# Patient Record
Sex: Male | Born: 1960 | Race: Black or African American | Hispanic: No | Marital: Married | Smoking: Never smoker
Health system: Southern US, Community
[De-identification: ages and names within clinical notes are randomized; demographics above are authoritative.]

## PROBLEM LIST (undated history)

## (undated) DIAGNOSIS — E559 Vitamin D deficiency, unspecified: Secondary | ICD-10-CM

## (undated) DIAGNOSIS — E119 Type 2 diabetes mellitus without complications: Secondary | ICD-10-CM

## (undated) DIAGNOSIS — E66812 Obesity, class 2: Secondary | ICD-10-CM

## (undated) DIAGNOSIS — E785 Hyperlipidemia, unspecified: Secondary | ICD-10-CM

## (undated) DIAGNOSIS — M1990 Unspecified osteoarthritis, unspecified site: Secondary | ICD-10-CM

## (undated) DIAGNOSIS — I10 Essential (primary) hypertension: Secondary | ICD-10-CM

## (undated) DIAGNOSIS — E669 Obesity, unspecified: Secondary | ICD-10-CM

## (undated) HISTORY — DX: Hyperlipidemia, unspecified: E78.5

## (undated) HISTORY — DX: Obesity, unspecified: E66.9

## (undated) HISTORY — DX: Obesity, class 2: E66.812

## (undated) HISTORY — DX: Vitamin D deficiency, unspecified: E55.9

## (undated) HISTORY — PX: CIRCUMCISION: SUR203

---

## 2016-08-04 ENCOUNTER — Emergency Department
Admission: EM | Admit: 2016-08-04 | Discharge: 2016-08-04 | Disposition: A | Discharge status: Home / Self Care | Payer: BLUE CROSS/BLUE SHIELD | Attending: Emergency Medicine | Admitting: Emergency Medicine

## 2016-08-04 ENCOUNTER — Emergency Department: Payer: BLUE CROSS/BLUE SHIELD

## 2016-08-04 ENCOUNTER — Encounter: Payer: Self-pay | Admitting: *Deleted

## 2016-08-04 DIAGNOSIS — I10 Essential (primary) hypertension: Secondary | ICD-10-CM | POA: Insufficient documentation

## 2016-08-04 DIAGNOSIS — S39012A Strain of muscle, fascia and tendon of lower back, initial encounter: Secondary | ICD-10-CM | POA: Insufficient documentation

## 2016-08-04 DIAGNOSIS — Y92410 Unspecified street and highway as the place of occurrence of the external cause: Secondary | ICD-10-CM | POA: Diagnosis not present

## 2016-08-04 DIAGNOSIS — Y9389 Activity, other specified: Secondary | ICD-10-CM | POA: Insufficient documentation

## 2016-08-04 DIAGNOSIS — S3992XA Unspecified injury of lower back, initial encounter: Secondary | ICD-10-CM | POA: Diagnosis present

## 2016-08-04 DIAGNOSIS — Y999 Unspecified external cause status: Secondary | ICD-10-CM | POA: Insufficient documentation

## 2016-08-04 DIAGNOSIS — R1310 Dysphagia, unspecified: Secondary | ICD-10-CM | POA: Diagnosis not present

## 2016-08-04 DIAGNOSIS — S8002XA Contusion of left knee, initial encounter: Secondary | ICD-10-CM | POA: Diagnosis not present

## 2016-08-04 DIAGNOSIS — E119 Type 2 diabetes mellitus without complications: Secondary | ICD-10-CM | POA: Diagnosis not present

## 2016-08-04 HISTORY — DX: Essential (primary) hypertension: I10

## 2016-08-04 HISTORY — DX: Type 2 diabetes mellitus without complications: E11.9

## 2016-08-04 MED ORDER — CYCLOBENZAPRINE HCL 5 MG PO TABS: 5.0000 mg | ORAL_TABLET | Freq: Three times a day (TID) | ORAL | 0 refills | Status: DC | PRN

## 2016-08-04 MED ORDER — NAPROXEN 500 MG PO TABS: 500.0000 mg | ORAL_TABLET | Freq: Two times a day (BID) | ORAL | 0 refills | Status: AC

## 2016-08-04 MED ORDER — NAPROXEN 500 MG PO TABS
500.0000 mg | ORAL_TABLET | Freq: Once | ORAL | Status: AC
  Administered 2016-08-04: 500 mg via ORAL
  Filled 2016-08-04: qty 1

## 2016-08-04 NOTE — ED Notes (Signed)
See provider assessment 
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
## 2017-05-17 ENCOUNTER — Telehealth: Payer: Self-pay

## 2017-05-17 ENCOUNTER — Other Ambulatory Visit: Payer: Self-pay

## 2017-05-17 DIAGNOSIS — Z1211 Encounter for screening for malignant neoplasm of colon: Secondary | ICD-10-CM

## 2017-05-17 NOTE — Telephone Encounter (Signed)
Gastroenterology Pre-Procedure Review  Request Date: 05/27/17 Requesting Physician: Dr. Allen Norris  PATIENT REVIEW QUESTIONS: The patient responded to the following health history questions as indicated:    1. Are you having any GI issues? no 2. Do you have a personal history of Polyps? no 3. Do you have a family history of Colon Cancer or Polyps? no 4. Diabetes Mellitus? yes (Oral meds type 2) 5. Joint replacements in the past 12 months?no 6. Major health problems in the past 3 months?no 7. Any artificial heart valves, MVP, or defibrillator?no    MEDICATIONS & ALLERGIES:    Patient reports the following regarding taking any anticoagulation/antiplatelet therapy:   Plavix, Coumadin, Eliquis, Xarelto, Lovenox, Pradaxa, Brilinta, or Effient? no Aspirin? yes (81 mg daily)  Patient confirms/reports the following medications:  Current Outpatient Medications  Medication Sig Dispense Refill  . cyclobenzaprine (FLEXERIL) 5 MG tablet Take 1 tablet (5 mg total) by mouth 3 (three) times daily as needed for muscle spasms. 20 tablet 0  . naproxen (NAPROSYN) 500 MG tablet Take 1 tablet (500 mg total) by mouth 2 (two) times daily with a meal. 30 tablet 0   No current facility-administered medications for this visit.     Patient confirms/reports the following allergies:  No Known Allergies  No orders of the defined types were placed in this encounter.   AUTHORIZATION INFORMATION Primary Insurance: 1D#: Group #:  Secondary Insurance: 1D#: Group #:  SCHEDULE INFORMATION: Date: 05/27/17 Time: Location:[facility]

## 2017-05-19 ENCOUNTER — Other Ambulatory Visit: Payer: Self-pay

## 2017-05-24 ENCOUNTER — Telehealth: Payer: Self-pay | Admitting: Gastroenterology

## 2017-05-24 MED ORDER — NA SULFATE-K SULFATE-MG SULF 17.5-3.13-1.6 GM/177ML PO SOLN: 1.0000 | Freq: Once | ORAL | 0 refills | Status: AC

## 2017-05-24 NOTE — Telephone Encounter (Signed)
Patient hasn't received his prepping package, please call patient.

## 2017-05-24 NOTE — Telephone Encounter (Signed)
Patient stated that he did not receive his colonoscopy instructions for 05/27/17.  I will email his instructions and electronically send his rx for Su-Prep to [facility].

## 2017-05-24 NOTE — Telephone Encounter (Signed)
Patient called back

## 2017-05-25 ENCOUNTER — Other Ambulatory Visit: Payer: Self-pay

## 2017-05-25 MED ORDER — PEG 3350-KCL-NABCB-NACL-NASULF 236 G PO SOLR: ORAL | 0 refills | Status: DC

## 2017-05-25 NOTE — Telephone Encounter (Signed)
Pt lm he wants to make sure we send rx to [facility] [address] for his colonoscopy  friday

## 2017-05-25 NOTE — Telephone Encounter (Signed)
Rx for Golytely has been sent to pt's pharmacy.

## 2017-05-26 ENCOUNTER — Encounter: Payer: Self-pay | Admitting: Nurse Practitioner

## 2017-05-27 ENCOUNTER — Ambulatory Visit: Payer: BLUE CROSS/BLUE SHIELD | Admitting: Anesthesiology

## 2017-05-27 ENCOUNTER — Ambulatory Visit
Admission: RE | Admit: 2017-05-27 | Discharge: 2017-05-27 | Disposition: A | Discharge status: Home / Self Care | Payer: BLUE CROSS/BLUE SHIELD | Source: Ambulatory Visit | Attending: Gastroenterology | Admitting: Gastroenterology

## 2017-05-27 ENCOUNTER — Encounter
Admission: RE | Disposition: A | Discharge status: Home / Self Care | Payer: Self-pay | Source: Ambulatory Visit | Attending: Gastroenterology

## 2017-05-27 DIAGNOSIS — E119 Type 2 diabetes mellitus without complications: Secondary | ICD-10-CM | POA: Insufficient documentation

## 2017-05-27 DIAGNOSIS — I10 Essential (primary) hypertension: Secondary | ICD-10-CM | POA: Insufficient documentation

## 2017-05-27 DIAGNOSIS — Z1211 Encounter for screening for malignant neoplasm of colon: Secondary | ICD-10-CM | POA: Diagnosis not present

## 2017-05-27 DIAGNOSIS — K640 First degree hemorrhoids: Secondary | ICD-10-CM | POA: Diagnosis not present

## 2017-05-27 DIAGNOSIS — Z79899 Other long term (current) drug therapy: Secondary | ICD-10-CM | POA: Diagnosis not present

## 2017-05-27 DIAGNOSIS — K635 Polyp of colon: Secondary | ICD-10-CM | POA: Diagnosis not present

## 2017-05-27 DIAGNOSIS — Z791 Long term (current) use of non-steroidal anti-inflammatories (NSAID): Secondary | ICD-10-CM | POA: Insufficient documentation

## 2017-05-27 DIAGNOSIS — D125 Benign neoplasm of sigmoid colon: Secondary | ICD-10-CM | POA: Diagnosis not present

## 2017-05-27 DIAGNOSIS — Z7984 Long term (current) use of oral hypoglycemic drugs: Secondary | ICD-10-CM | POA: Diagnosis not present

## 2017-05-27 DIAGNOSIS — D123 Benign neoplasm of transverse colon: Secondary | ICD-10-CM | POA: Diagnosis not present

## 2017-05-27 DIAGNOSIS — K5730 Diverticulosis of large intestine without perforation or abscess without bleeding: Secondary | ICD-10-CM | POA: Insufficient documentation

## 2017-05-27 DIAGNOSIS — Z7982 Long term (current) use of aspirin: Secondary | ICD-10-CM | POA: Diagnosis not present

## 2017-05-27 HISTORY — DX: Unspecified osteoarthritis, unspecified site: M19.90

## 2017-05-27 HISTORY — PX: POLYPECTOMY: SHX5525

## 2017-05-27 HISTORY — PX: COLONOSCOPY WITH PROPOFOL: SHX5780

## 2017-05-27 LAB — GLUCOSE, CAPILLARY
GLUCOSE-CAPILLARY: 77 mg/dL (ref 65–99)
Glucose-Capillary: 74 mg/dL (ref 65–99)

## 2017-05-27 SURGERY — COLONOSCOPY WITH PROPOFOL
Anesthesia: General | Site: Rectum | Wound class: Contaminated

## 2017-05-27 MED ORDER — PROPOFOL 10 MG/ML IV BOLUS
INTRAVENOUS | Status: DC | PRN
  Administered 2017-05-27: 60 mg via INTRAVENOUS
  Administered 2017-05-27 (×3): 20 mg via INTRAVENOUS
  Administered 2017-05-27: 60 mg via INTRAVENOUS
  Administered 2017-05-27 (×7): 20 mg via INTRAVENOUS
  Administered 2017-05-27: 60 mg via INTRAVENOUS
  Administered 2017-05-27 (×2): 20 mg via INTRAVENOUS

## 2017-05-27 MED ORDER — LACTATED RINGERS IV SOLN
INTRAVENOUS | Status: DC
  Administered 2017-05-27: 08:00:00 via INTRAVENOUS

## 2017-05-27 MED ORDER — FENTANYL CITRATE (PF) 100 MCG/2ML IJ SOLN: 25.0000 ug | INTRAMUSCULAR | Status: DC | PRN

## 2017-05-27 MED ORDER — LIDOCAINE HCL (CARDIAC) 20 MG/ML IV SOLN
INTRAVENOUS | Status: DC | PRN
  Administered 2017-05-27: 30 mg via INTRAVENOUS

## 2017-05-27 MED ORDER — SODIUM CHLORIDE 0.9 % IV SOLN: INTRAVENOUS | Status: DC

## 2017-05-27 MED ORDER — OXYCODONE HCL 5 MG/5ML PO SOLN: 5.0000 mg | Freq: Once | ORAL | Status: DC | PRN

## 2017-05-27 MED ORDER — MEPERIDINE HCL 25 MG/ML IJ SOLN: 6.2500 mg | INTRAMUSCULAR | Status: DC | PRN

## 2017-05-27 MED ORDER — OXYCODONE HCL 5 MG PO TABS: 5.0000 mg | ORAL_TABLET | Freq: Once | ORAL | Status: DC | PRN

## 2017-05-27 MED ORDER — PROMETHAZINE HCL 25 MG/ML IJ SOLN: 6.2500 mg | INTRAMUSCULAR | Status: DC | PRN

## 2017-05-27 MED ORDER — STERILE WATER FOR IRRIGATION IR SOLN
Status: DC | PRN
  Administered 2017-05-27: .5 mL

## 2017-05-27 SURGICAL SUPPLY — 8 items
CANISTER SUCT 1200ML W/VALVE (MISCELLANEOUS) ×4 IMPLANT
FORCEPS BIOP RAD 4 LRG CAP 4 (CUTTING FORCEPS) ×4 IMPLANT
GOWN CVR UNV OPN BCK APRN NK (MISCELLANEOUS) ×4 IMPLANT
GOWN ISOL THUMB LOOP REG UNIV (MISCELLANEOUS) ×8
KIT ENDO PROCEDURE OLY (KITS) ×4 IMPLANT
SNARE SHORT THROW 13M SML OVAL (MISCELLANEOUS) ×2 IMPLANT
TRAP ETRAP POLY (MISCELLANEOUS) ×2 IMPLANT
WATER STERILE IRR 250ML POUR (IV SOLUTION) ×4 IMPLANT

## 2017-05-27 NOTE — Anesthesia Preprocedure Evaluation (Signed)
Anesthesia Evaluation  Patient identified by MRN, date of birth, ID band Patient awake    Reviewed: Allergy & Precautions, H&P , NPO status , Patient's Chart, lab work & pertinent test results, reviewed documented beta blocker date and time   Airway Mallampati: II  TM Distance: >3 FB Neck ROM: full    Dental no notable dental hx.    Pulmonary neg pulmonary ROS,    Pulmonary exam normal breath sounds clear to auscultation       Cardiovascular Exercise Tolerance: Good hypertension,  Rhythm:regular Rate:Normal     Neuro/Psych negative neurological ROS  negative psych ROS   GI/Hepatic negative GI ROS, Neg liver ROS,   Endo/Other  diabetes  Renal/GU negative Renal ROS  negative genitourinary   Musculoskeletal   Abdominal   Peds  Hematology negative hematology ROS (+)   Anesthesia Other Findings   Reproductive/Obstetrics negative OB ROS                             Anesthesia Physical Anesthesia Plan  ASA: II  Anesthesia Plan: General   Post-op Pain Management:    Induction:   PONV Risk Score and Plan:   Airway Management Planned:   Additional Equipment:   Intra-op Plan:   Post-operative Plan:   Informed Consent: I have reviewed the patients History and Physical, chart, labs and discussed the procedure including the risks, benefits and alternatives for the proposed anesthesia with the patient or authorized representative who has indicated his/her understanding and acceptance.   Dental Advisory Given  Plan Discussed with: CRNA  Anesthesia Plan Comments:         Anesthesia Quick Evaluation

## 2017-05-27 NOTE — Anesthesia Procedure Notes (Signed)
Procedure Name: MAC Performed by: Evadna Donaghy, CRNA Pre-anesthesia Checklist: Patient identified, Emergency Drugs available, Suction available, Patient being monitored and Timeout performed Patient Re-evaluated:Patient Re-evaluated prior to induction Oxygen Delivery Method: Nasal cannula       

## 2017-05-27 NOTE — Op Note (Signed)
[facility] Gastroenterology Patient Name: Nathan Armstrong Procedure Date: 05/27/2017 7:35 AM MRN: [id number] Account #: [id number] Date of Birth: 09/26/1960 Admit Type: Outpatient Age: 57 Room: [facility] OR ROOM 01 Gender: Male Note Status: Finalized Procedure:            Colonoscopy Indications:          Screening for colorectal malignant neoplasm Providers:            [name] MD, MD Referring MD:         [place], MD (Referring MD) Medicines:            Propofol per Anesthesia Complications:        No immediate complications. Procedure:            Pre-Anesthesia Assessment:                       - Prior to the procedure, a History and Physical was                        performed, and patient medications and allergies were                        reviewed. The patient's tolerance of previous                        anesthesia was also reviewed. The risks and benefits of                        the procedure and the sedation options and risks were                        discussed with the patient. All questions were                        answered, and informed consent was obtained. Prior                        Anticoagulants: The patient has taken no previous                        anticoagulant or antiplatelet agents. ASA Grade                        Assessment: II - A patient with mild systemic disease.                        After reviewing the risks and benefits, the patient was                        deemed in satisfactory condition to undergo the                        procedure.                       After obtaining informed consent, the colonoscope was                        passed under direct vision. Throughout the procedure,  the patient's blood pressure, pulse, and oxygen                        saturations were monitored continuously. The Olympus                        Colonoscope [id number]) was  introduced through the                        anus and advanced to the the cecum, identified by                        appendiceal orifice and ileocecal valve. The                        colonoscopy was performed without difficulty. The                        patient tolerated the procedure well. The quality of                        the bowel preparation was excellent. Findings:      The perianal and digital rectal examinations were normal.      Four sessile polyps were found in the sigmoid colon. The polyps were 3       to 4 mm in size. These polyps were removed with a cold biopsy forceps.       Resection and retrieval were complete.      A 4 mm polyp was found in the transverse colon. The polyp was sessile.       The polyp was removed with a cold biopsy forceps. Resection and       retrieval were complete.      Non-bleeding internal hemorrhoids were found during retroflexion. The       hemorrhoids were Grade I (internal hemorrhoids that do not prolapse).      A few small-mouthed diverticula were found in the sigmoid colon. Impression:           - Four 3 to 4 mm polyps in the sigmoid colon, removed                        with a cold biopsy forceps. Resected and retrieved.                       - One 4 mm polyp in the transverse colon, removed with                        a cold biopsy forceps. Resected and retrieved.                       - Non-bleeding internal hemorrhoids.                       - Diverticulosis in the sigmoid colon. Recommendation:       - Discharge patient to home.                       - Resume previous diet.                       -  Continue present medications.                       - Await pathology results.                       - Repeat colonoscopy in 5 years if polyp adenoma and 10                        years if hyperplastic Procedure Code(s):    --- Professional ---                       [phone], Colonoscopy, flexible; with biopsy, single or                         multiple Diagnosis Code(s):    --- Professional ---                       Z12.11, Encounter for screening for malignant neoplasm                        of colon                       D12.5, Benign neoplasm of sigmoid colon                       D12.3, Benign neoplasm of transverse colon (hepatic                        flexure or splenic flexure) CPT copyright 2016 American Medical Association. All rights reserved. The codes documented in this report are preliminary and upon coder review may  be revised to meet current compliance requirements. [name] MD, MD 05/27/2017 8:26:06 AM This report has been signed electronically. Number of Addenda: 0 Note Initiated On: 05/27/2017 7:35 AM Scope Withdrawal Time: 0 hours 6 minutes 27 seconds  Total Procedure Duration: 0 hours 13 minutes 29 seconds       [facility]

## 2017-05-27 NOTE — Anesthesia Postprocedure Evaluation (Signed)
Anesthesia Post Note  Patient: Nathan Armstrong  Procedure(s) Performed: COLONOSCOPY WITH PROPOFOL (N/A Rectum) POLYPECTOMY (Rectum)  Patient location during evaluation: PACU Anesthesia Type: General Level of consciousness: awake and alert Pain management: pain level controlled Vital Signs Assessment: post-procedure vital signs reviewed and stable Respiratory status: spontaneous breathing, nonlabored ventilation, respiratory function stable and patient connected to nasal cannula oxygen Cardiovascular status: blood pressure returned to baseline and stable Postop Assessment: no apparent nausea or vomiting Anesthetic complications: no    SCOURAS, NICOLE ELAINE

## 2017-05-27 NOTE — H&P (Signed)
Nathan Lame, MD [facility] [address] [place], [place] [address] Phone: [phone] Fax : [phone]  Primary Care Physician:  Elisabeth Cara, NP Primary Gastroenterologist:  Dr. Allen Norris  Pre-Procedure History & Physical: HPI:  Nathan Armstrong is a 57 y.o. male is here for a screening colonoscopy.   Past Medical History:  Diagnosis Date  . Arthritis   . Diabetes mellitus without complication ([place])   . Hypertension     Past Surgical History:  Procedure Laterality Date  . CIRCUMCISION      Prior to Admission medications   Medication Sig Start Date End Date Taking? Authorizing Provider  aspirin 81 MG chewable tablet Chew by mouth daily.   Yes [provider]  carvedilol (COREG) 25 MG tablet Take 25 mg by mouth 2 (two) times daily with a meal.   Yes [provider]  glipiZIDE (GLUCOTROL) 10 MG tablet Take 10 mg by mouth daily before breakfast.   Yes [provider]  hydrochlorothiazide (HYDRODIURIL) 25 MG tablet Take 25 mg by mouth daily.   Yes [provider]  linagliptin (TRADJENTA) 5 MG TABS tablet Take 5 mg by mouth daily.   Yes [provider]  lisinopril (PRINIVIL,ZESTRIL) 40 MG tablet Take 40 mg by mouth daily.   Yes [provider]  metFORMIN (GLUCOPHAGE) 500 MG tablet Take 500 mg by mouth 2 (two) times daily with a meal.   Yes [provider]  Multiple Vitamin (MULTIVITAMIN) capsule Take 1 capsule by mouth daily.   Yes [provider]  naproxen (NAPROSYN) 500 MG tablet Take 1 tablet (500 mg total) by mouth 2 (two) times daily with a meal. 08/04/16 08/04/17 Yes Little, Traci M, PA-C  simvastatin (ZOCOR) 20 MG tablet Take 20 mg by mouth daily.   Yes [provider]  cyclobenzaprine (FLEXERIL) 5 MG tablet Take 1 tablet (5 mg total) by mouth 3 (three) times daily as needed for muscle spasms. Patient not taking: Reported on 05/27/2017 08/04/16   Little, Traci M, PA-C  polyethylene glycol  (GOLYTELY) 236 g solution Drink one 8 oz glass every 20 mins until stools are clear 05/25/17   Nathan Lame, MD    Allergies as of 05/17/2017  . (No Known Allergies)    History reviewed. No pertinent family history.  Social History   Socioeconomic History  . Marital status: Single    Spouse name: Not on file  . Number of children: Not on file  . Years of education: Not on file  . Highest education level: Not on file  Social Needs  . Financial resource strain: Not on file  . Food insecurity - worry: Not on file  . Food insecurity - inability: Not on file  . Transportation needs - medical: Not on file  . Transportation needs - non-medical: Not on file  Occupational History  . Not on file  Tobacco Use  . Smoking status: Never Smoker  . Smokeless tobacco: Never Used  Substance and Sexual Activity  . Alcohol use: Yes    Comment: occasional  . Drug use: No  . Sexual activity: Not on file  Other Topics Concern  . Not on file  Social History Narrative  . Not on file    Review of Systems: See HPI, otherwise negative ROS  Physical Exam: BP (!) 148/91   Pulse 72   Temp 97.7 F (36.5 C)   Ht 6\' 2"  (1.88 m)   Wt 297 lb (134.7 kg)   SpO2 98%  BMI 38.13 kg/m  General:   Alert,  pleasant and cooperative in NAD Head:  Normocephalic and atraumatic. Neck:  Supple; no masses or thyromegaly. Lungs:  Clear throughout to auscultation.    Heart:  Regular rate and rhythm. Abdomen:  Soft, nontender and nondistended. Normal bowel sounds, without guarding, and without rebound.   Neurologic:  Alert and  oriented x4;  grossly normal neurologically.  Impression/Plan: Nathan Armstrong is now here to undergo a screening colonoscopy.  Risks, benefits, and alternatives regarding colonoscopy have been reviewed with the patient.  Questions have been answered.  All parties agreeable.

## 2017-05-27 NOTE — Transfer of Care (Signed)
Immediate Anesthesia Transfer of Care Note  Patient: Nathan Armstrong  Procedure(s) Performed: COLONOSCOPY WITH PROPOFOL (N/A Rectum) POLYPECTOMY (Rectum)  Patient Location: PACU  Anesthesia Type: General  Level of Consciousness: awake, alert  and patient cooperative  Airway and Oxygen Therapy: Patient Spontanous Breathing and Patient connected to supplemental oxygen  Post-op Assessment: Post-op Vital signs reviewed, Patient's Cardiovascular Status Stable, Respiratory Function Stable, Patent Airway and No signs of Nausea or vomiting  Post-op Vital Signs: Reviewed and stable  Complications: No apparent anesthesia complications

## 2017-05-30 ENCOUNTER — Encounter: Payer: Self-pay | Admitting: Gastroenterology

## 2018-07-31 LAB — COMPREHENSIVE METABOLIC PANEL
Albumin: 4.7 (ref 3.5–5.0)
Calcium: 10.3 (ref 8.7–10.7)
GFR calc Af Amer: 48
GFR calc non Af Amer: 41
Globulin: 3

## 2018-07-31 LAB — LIPID PANEL
Cholesterol: 136 (ref 0–200)
HDL: 46 (ref 35–70)
LDL Cholesterol: 64
LDl/HDL Ratio: 1.4
Triglycerides: 129 (ref 40–160)

## 2018-07-31 LAB — BASIC METABOLIC PANEL
BUN: 29 — AB (ref 4–21)
CO2: 24 — AB (ref 13–22)
Chloride: 101 (ref 99–108)
Creatinine: 1.8 — AB (ref 0.6–1.3)
Glucose: 97
Potassium: 4.7 (ref 3.4–5.3)
Sodium: 137 (ref 137–147)

## 2018-07-31 LAB — HEPATIC FUNCTION PANEL
ALT: 15 (ref 10–40)
AST: 18 (ref 14–40)
Alkaline Phosphatase: 49 (ref 25–125)
Bilirubin, Total: 0.2

## 2018-07-31 LAB — HEMOGLOBIN A1C: Hemoglobin A1C: 9.5
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
## 2019-01-23 ENCOUNTER — Other Ambulatory Visit: Payer: Self-pay

## 2019-01-23 DIAGNOSIS — Z20822 Contact with and (suspected) exposure to covid-19: Secondary | ICD-10-CM

## 2019-01-24 LAB — NOVEL CORONAVIRUS, NAA: SARS-CoV-2, NAA: DETECTED — AB

## 2019-02-15 LAB — BASIC METABOLIC PANEL
BUN: 21 (ref 4–21)
CO2: 22 (ref 13–22)
Chloride: 101 (ref 99–108)
Creatinine: 1.4 — AB (ref <=1.3)
Glucose: 160
Potassium: 4.5 (ref 3.4–5.3)
Sodium: 139 (ref 137–147)

## 2019-02-15 LAB — HEPATIC FUNCTION PANEL
ALT: 13 (ref 10–40)
AST: 15 (ref 14–40)
Alkaline Phosphatase: 55 (ref 25–125)

## 2019-02-15 LAB — LIPID PANEL
Cholesterol: 143 (ref 0–200)
HDL: 43 (ref 35–70)
LDL Cholesterol: 57
LDl/HDL Ratio: 1.3
Triglycerides: 276 — AB (ref 40–160)

## 2019-02-15 LAB — COMPREHENSIVE METABOLIC PANEL
Albumin: 4 (ref 3.5–5.0)
Calcium: 9.4 (ref 8.7–10.7)
GFR calc Af Amer: 61
GFR calc non Af Amer: 53
Globulin: 3.7

## 2019-02-15 LAB — HEMOGLOBIN A1C: Hemoglobin A1C: 9.8

## 2019-05-23 LAB — CBC AND DIFFERENTIAL
HCT: 34 — AB (ref 41–53)
Hemoglobin: 11.4 — AB (ref 13.5–17.5)
Platelets: 181 (ref 150–399)
WBC: 6.2

## 2019-05-23 LAB — HEMOGLOBIN A1C: Hemoglobin A1C: 7.3

## 2019-05-23 LAB — BASIC METABOLIC PANEL
BUN: 28 — AB (ref 4–21)
CO2: 22 (ref 13–22)
Chloride: 104 (ref 99–108)
Creatinine: 1.6 — AB (ref 0.6–1.3)
Glucose: 80
Potassium: 4.6 (ref 3.4–5.3)
Sodium: 141 (ref 137–147)

## 2019-05-23 LAB — LIPID PANEL
Cholesterol: 110 (ref 0–200)
HDL: 42 (ref 35–70)
LDL Cholesterol: 51
Triglycerides: 83 (ref 40–160)

## 2019-05-23 LAB — COMPREHENSIVE METABOLIC PANEL
Albumin: 4 (ref 3.5–5.0)
Calcium: 9.4 (ref 8.7–10.7)
GFR calc Af Amer: 53
GFR calc non Af Amer: 45
Globulin: 3.3

## 2019-05-23 LAB — CBC: RBC: 3.87 (ref 3.87–5.11)

## 2019-05-24 LAB — LIPID PANEL
Cholesterol: 110 (ref 0–200)
HDL: 42 (ref 35–70)
LDL Cholesterol: 51

## 2019-05-24 LAB — BASIC METABOLIC PANEL: Creatinine: 1.6 — AB (ref 0.6–1.3)

## 2019-05-24 LAB — HEMOGLOBIN A1C: Hemoglobin A1C: 7.3

## 2019-05-24 LAB — TSH: TSH: 1.48 (ref 0.41–5.90)

## 2019-05-24 LAB — MICROALBUMIN, URINE: Microalb, Ur: 30

## 2019-06-12 ENCOUNTER — Ambulatory Visit: Payer: BC Managed Care – PPO | Attending: Neurology

## 2019-06-12 DIAGNOSIS — G4733 Obstructive sleep apnea (adult) (pediatric): Secondary | ICD-10-CM | POA: Diagnosis not present

## 2019-06-13 ENCOUNTER — Other Ambulatory Visit: Payer: Self-pay

## 2019-07-23 ENCOUNTER — Other Ambulatory Visit
Admission: RE | Admit: 2019-07-23 | Discharge: 2019-07-23 | Disposition: A | Discharge status: Home / Self Care | Payer: BC Managed Care – PPO | Source: Ambulatory Visit | Attending: Family Medicine | Admitting: Family Medicine

## 2019-07-23 ENCOUNTER — Other Ambulatory Visit: Payer: Self-pay

## 2019-07-23 DIAGNOSIS — Z20822 Contact with and (suspected) exposure to covid-19: Secondary | ICD-10-CM | POA: Diagnosis not present

## 2019-07-23 DIAGNOSIS — Z01812 Encounter for preprocedural laboratory examination: Secondary | ICD-10-CM | POA: Insufficient documentation

## 2019-07-24 LAB — SARS CORONAVIRUS 2 (TAT 6-24 HRS): SARS Coronavirus 2: NEGATIVE

## 2019-07-25 ENCOUNTER — Ambulatory Visit: Payer: BC Managed Care – PPO | Attending: Nurse Practitioner

## 2019-07-25 DIAGNOSIS — G4733 Obstructive sleep apnea (adult) (pediatric): Secondary | ICD-10-CM | POA: Diagnosis present

## 2019-07-26 ENCOUNTER — Other Ambulatory Visit: Payer: Self-pay

## 2019-09-08 LAB — CBC AND DIFFERENTIAL
HCT: 32 — AB (ref 41–53)
Hemoglobin: 10.4 — AB (ref 13.5–17.5)
Neutrophils Absolute: 4
Platelets: 198 (ref 150–399)
WBC: 7.3

## 2019-09-08 LAB — BASIC METABOLIC PANEL
BUN: 27 — AB (ref 4–21)
CO2: 22 (ref 13–22)
Chloride: 105 (ref 99–108)
Creatinine: 1.4 — AB (ref 0.6–1.3)
Glucose: 90
Potassium: 4.7 (ref 3.4–5.3)
Sodium: 139 (ref 137–147)

## 2019-09-08 LAB — HEPATIC FUNCTION PANEL
ALT: 9 — AB (ref 10–40)
AST: 15 (ref 14–40)
Alkaline Phosphatase: 47 (ref 25–125)

## 2019-09-08 LAB — HEMOGLOBIN A1C: Hemoglobin A1C: 6.2

## 2019-09-08 LAB — LIPID PANEL
Cholesterol: 132 (ref 0–200)
HDL: 47 (ref 35–70)
LDL Cholesterol: 64
Triglycerides: 120 (ref 40–160)

## 2019-09-08 LAB — COMPREHENSIVE METABOLIC PANEL
Albumin: 4.2 (ref 3.5–5.0)
Calcium: 9.6 (ref 8.7–10.7)
GFR calc Af Amer: 64
GFR calc non Af Amer: 55
Globulin: 3.2

## 2019-09-08 LAB — TSH: TSH: 1 (ref 0.41–5.90)

## 2019-09-08 LAB — CBC: RBC: 3.51 — AB (ref 3.87–5.11)

## 2020-07-01 ENCOUNTER — Other Ambulatory Visit: Payer: Self-pay

## 2020-07-01 ENCOUNTER — Encounter: Payer: Self-pay | Admitting: Family Medicine

## 2020-07-01 ENCOUNTER — Ambulatory Visit (INDEPENDENT_AMBULATORY_CARE_PROVIDER_SITE_OTHER): Payer: No Typology Code available for payment source | Admitting: Family Medicine

## 2020-07-01 VITALS — BP 128/70 | HR 63 | Temp 97.9°F | Resp 18 | Ht 74.0 in | Wt 259.0 lb

## 2020-07-01 DIAGNOSIS — E785 Hyperlipidemia, unspecified: Secondary | ICD-10-CM | POA: Diagnosis not present

## 2020-07-01 DIAGNOSIS — N400 Enlarged prostate without lower urinary tract symptoms: Secondary | ICD-10-CM

## 2020-07-01 DIAGNOSIS — E1169 Type 2 diabetes mellitus with other specified complication: Secondary | ICD-10-CM

## 2020-07-01 NOTE — Progress Notes (Signed)
Provider:  Alain Honey, MD  Careteam: Patient Care Team: Wardell Honour, MD as PCP - General (Family Medicine)  PLACE OF SERVICE:  [place] Directive information Does Patient Have a Medical Advance Directive?: No, Would patient like information on creating a medical advance directive?: No - Patient declined  No Known Allergies  Chief Complaint  Patient presents with  . Establish Care    Establish Care     HPI: Patient is a 60 y.o. male .  He is new here.  He had previously seen a physician at [facility] in [place] and even though he lives in [place] he formerly lived in [place] and prefers to come to [place] for medical care.  He has a history of diabetes, hypertension, and hyperlipidemia. His job requires lots of walking and he occasionally has some swelling in his left knee.  This has been going on for the past 1 to 1-1/2 months. He recalls last A1c being in the low sevens.  Lipids have been managed with simvastatin and there are no side effects with the statin. He has no prostate issues, being African-American male.  There is no family history of prostate cancer.  Review of Systems:  Review of Systems  Constitutional: Negative.   HENT: Negative.   Eyes: Negative.   Respiratory: Negative.   Cardiovascular: Negative.   Gastrointestinal: Negative.   Genitourinary: Negative.   Musculoskeletal: Positive for joint pain.       L knee with swelling but no crepitance  Skin: Negative.   Neurological: Negative.   Psychiatric/Behavioral: Negative.   All other systems reviewed and are negative.   Past Medical History:  Diagnosis Date  . Arthritis   . Diabetes mellitus without complication ([place])   . Hypertension    Past Surgical History:  Procedure Laterality Date  . CIRCUMCISION    . COLONOSCOPY WITH PROPOFOL N/A 05/27/2017   Procedure: COLONOSCOPY WITH PROPOFOL;  Surgeon: [name], MD;  Location: [facility];  Service:  Endoscopy;  Laterality: N/A;  Diabetic  . POLYPECTOMY  05/27/2017   Procedure: POLYPECTOMY;  Surgeon: [name], MD;  Location: [place];  Service: Endoscopy;;   Social History:   reports that he has never smoked. He has never used smokeless tobacco. He reports current alcohol use. He reports that he does not use drugs.  History reviewed. No pertinent family history.  Medications: Patient's Medications  New Prescriptions   No medications on file  Previous Medications   ASPIRIN 81 MG CHEWABLE TABLET    Chew by mouth daily.   CARVEDILOL (COREG) 25 MG TABLET    Take 25 mg by mouth 2 (two) times daily with a meal.   CETIRIZINE (ZYRTEC) 10 MG TABLET    Take 10 mg by mouth daily.   GLIPIZIDE (GLUCOTROL) 10 MG TABLET    Take 10 mg by mouth 2 (two) times daily before a meal.   HYDROCHLOROTHIAZIDE (HYDRODIURIL) 25 MG TABLET    Take 25 mg by mouth daily.   LINAGLIPTIN (TRADJENTA) 5 MG TABS TABLET    Take 5 mg by mouth daily.   LISINOPRIL (PRINIVIL,ZESTRIL) 40 MG TABLET    Take 40 mg by mouth daily.   METFORMIN (GLUCOPHAGE) 500 MG TABLET    Take 500 mg by mouth 2 (two) times daily with a meal.   MULTIPLE VITAMIN (MULTIVITAMIN) CAPSULE    Take 1 capsule by mouth daily.   NAPROXEN SODIUM (ALEVE) 220 MG TABLET    Take 220  mg by mouth as needed.   SIMVASTATIN (ZOCOR) 20 MG TABLET    Take 20 mg by mouth daily.   TIZANIDINE (ZANAFLEX) 4 MG TABLET    Take 4 mg by mouth 3 (three) times daily. For back pain  Modified Medications   No medications on file  Discontinued Medications   CYCLOBENZAPRINE (FLEXERIL) 5 MG TABLET    Take 1 tablet (5 mg total) by mouth 3 (three) times daily as needed for muscle spasms.   POLYETHYLENE GLYCOL (GOLYTELY) 236 G SOLUTION    Drink one 8 oz glass every 20 mins until stools are clear    Physical Exam:  Vitals:   07/01/20 1308  BP: 128/70  Pulse: 63  Resp: 18  Temp: 97.9 F (36.6 C)  TempSrc: Temporal  SpO2: 98%  Weight: 259 lb (117.5 kg)  Height: 6'  2" (1.88 m)   Body mass index is 33.25 kg/m. Wt Readings from Last 3 Encounters:  07/01/20 259 lb (117.5 kg)  05/27/17 297 lb (134.7 kg)  08/04/16 280 lb (127 kg)    Physical Exam Vitals and nursing note reviewed.  Constitutional:      Appearance: Normal appearance. He is obese.  HENT:     Head: Normocephalic.     Right Ear: Tympanic membrane normal.     Left Ear: Tympanic membrane normal.     Nose: Nose normal.     Mouth/Throat:     Mouth: Mucous membranes are moist.  Eyes:     Extraocular Movements: Extraocular movements intact.     Pupils: Pupils are equal, round, and reactive to light.  Cardiovascular:     Pulses: Normal pulses.     Heart sounds: Normal heart sounds.  Pulmonary:     Effort: Pulmonary effort is normal.     Breath sounds: Normal breath sounds.  Abdominal:     Palpations: Abdomen is soft.     Tenderness: There is abdominal tenderness. There is no guarding.  Genitourinary:    Penis: Normal.      Prostate: Normal.     Rectum: Normal.  Musculoskeletal:     Comments: There is some effusion of the left knee.  No crepitance felt.  Stability tests are normal.  Skin:    General: Skin is warm and dry.  Neurological:     General: No focal deficit present.     Mental Status: He is alert and oriented to person, place, and time.  Psychiatric:        Mood and Affect: Mood normal.        Behavior: Behavior normal.        Thought Content: Thought content normal.     Labs reviewed: Basic Metabolic Panel: No results for input(s): NA, K, CL, CO2, GLUCOSE, BUN, CREATININE, CALCIUM, MG, PHOS, TSH in the last 8760 hours. Liver Function Tests: No results for input(s): AST, ALT, ALKPHOS, BILITOT, PROT, ALBUMIN in the last 8760 hours. No results for input(s): LIPASE, AMYLASE in the last 8760 hours. No results for input(s): AMMONIA in the last 8760 hours. CBC: No results for input(s): WBC, NEUTROABS, HGB, HCT, MCV, PLT in the last 8760 hours. Lipid Panel: No  results for input(s): CHOL, HDL, LDLCALC, TRIG, CHOLHDL, LDLDIRECT in the last 8760 hours. TSH: No results for input(s): TSH in the last 8760 hours. A1C: No results found for: HGBA1C   Assessment/Plan  1. Type 2 diabetes mellitus with other specified complication, without long-term current use of insulin [facility]) Patient doing well with the combination  sulfonylurea Metformin Tradjenta and other unknown injectable weekly medicine.  Need to get baseline A1c today - Hemoglobin A1c - CMP  2. Hyperlipidemia associated with type 2 diabetes mellitus ([place]) Lipids have been controlled historically.  I do not have access to any previous labs so baseline today - Lipid Panel - CMP  3. Benign prostatic hyperplasia without lower urinary tract symptoms Prostate exam is unremarkable except some hypertrophy no nodules appreciated - PSA   Alain Honey, MD [facility] [phone]

## 2020-07-02 LAB — COMPREHENSIVE METABOLIC PANEL
AG Ratio: 1.2 (calc) (ref 1.0–2.5)
ALT: 12 U/L (ref 9–46)
AST: 16 U/L (ref 10–35)
Albumin: 4.3 g/dL (ref 3.6–5.1)
Alkaline phosphatase (APISO): 54 U/L (ref 35–144)
BUN/Creatinine Ratio: 19 (calc) (ref 6–22)
BUN: 26 mg/dL — ABNORMAL HIGH (ref 7–25)
CO2: 27 mmol/L (ref 20–32)
Calcium: 9.9 mg/dL (ref 8.6–10.3)
Chloride: 105 mmol/L (ref 98–110)
Creat: 1.34 mg/dL — ABNORMAL HIGH (ref 0.70–1.33)
Globulin: 3.6 g/dL (calc) (ref 1.9–3.7)
Glucose, Bld: 56 mg/dL — ABNORMAL LOW (ref 65–99)
Potassium: 4.1 mmol/L (ref 3.5–5.3)
Sodium: 138 mmol/L (ref 135–146)
Total Bilirubin: 0.3 mg/dL (ref 0.2–1.2)
Total Protein: 7.9 g/dL (ref 6.1–8.1)

## 2020-07-02 LAB — HEMOGLOBIN A1C
Hgb A1c MFr Bld: 6 % of total Hgb — ABNORMAL HIGH (ref <5.7)
Mean Plasma Glucose: 126 mg/dL
eAG (mmol/L): 7 mmol/L

## 2020-07-02 LAB — LIPID PANEL
Cholesterol: 128 mg/dL (ref <200)
HDL: 52 mg/dL (ref >=40)
LDL Cholesterol (Calc): 63 mg/dL (calc)
Non-HDL Cholesterol (Calc): 76 mg/dL (calc) (ref <130)
Total CHOL/HDL Ratio: 2.5 (calc) (ref <5.0)
Triglycerides: 52 mg/dL (ref <150)

## 2020-07-02 LAB — PSA: PSA: 0.47 ng/mL (ref <=4.0)

## 2020-08-15 ENCOUNTER — Ambulatory Visit: Payer: No Typology Code available for payment source | Admitting: Orthopedic Surgery

## 2020-08-15 ENCOUNTER — Other Ambulatory Visit: Payer: Self-pay

## 2020-08-15 ENCOUNTER — Encounter: Payer: Self-pay | Admitting: Orthopedic Surgery

## 2020-08-15 VITALS — BP 142/78 | HR 63 | Temp 97.5°F | Resp 18 | Ht 74.0 in | Wt 261.6 lb

## 2020-08-15 DIAGNOSIS — G8929 Other chronic pain: Secondary | ICD-10-CM | POA: Diagnosis not present

## 2020-08-15 DIAGNOSIS — M545 Low back pain, unspecified: Secondary | ICD-10-CM | POA: Diagnosis not present

## 2020-08-15 MED ORDER — GABAPENTIN 100 MG PO CAPS: 100.0000 mg | ORAL_CAPSULE | Freq: Every day | ORAL | 3 refills | Status: DC

## 2020-08-15 NOTE — Progress Notes (Signed)
Careteam: Patient Care Team: Wardell Honour, MD as PCP - General (Family Medicine)  Seen by: Windell Moulding, AGNP-C  PLACE OF SERVICE:  [place] Directive information Does Patient Have a Medical Advance Directive?: No, Would patient like information on creating a medical advance directive?: No - Patient declined  No Known Allergies  Chief Complaint  Patient presents with  . Acute Visit    Patient complains of back pain for 1 week. Patient states he has been taking tylenol 2 325 tabs and Tizanidine 4mg  as needed with little relief.     HPI: Patient is a 60 y.o. male seen today for acute visit for back pain.   Reports back pain for past week. Initial onset when he woke up in the morning. Unsure if he slept in a awkward position. Initially his right foot and knee hurt. Pain in foot and knee resolved and back pain began. He reports history of intermittent back pain the last 20 years. Denies recent heavy lifting or strenous activity. Back pain rated 7/10. Described as sharp. He has been taking tizanidine 2-3 times a day. He has also been ambulating about 4000 steps at his job daily. Muscle relaxer and walking have helped reduce pain.   He had a surgical procedure in the past requiring spinal anesthesia. Reports having intermittent back pain after procedure. He can feel a knot where the spinal was done.   Review of Systems:  Review of Systems  Constitutional: Negative for chills, fever, malaise/fatigue and weight loss.  Respiratory: Negative for cough, shortness of breath and wheezing.   Cardiovascular: Negative for chest pain and palpitations.  Musculoskeletal: Positive for back pain. Negative for falls.  Neurological: Negative for tingling, tremors and weakness.  Psychiatric/Behavioral: Negative for depression. The patient is not nervous/anxious.     Past Medical History:  Diagnosis Date  . Arthritis   . Class 2 obesity   . Diabetes mellitus without complication  ([place])   . HLD (hyperlipidemia)   . Hypertension   . Vitamin D deficiency    Past Surgical History:  Procedure Laterality Date  . CIRCUMCISION    . COLONOSCOPY WITH PROPOFOL N/A 05/27/2017   Procedure: COLONOSCOPY WITH PROPOFOL;  Surgeon: [name], MD;  Location: [facility];  Service: Endoscopy;  Laterality: N/A;  Diabetic  . POLYPECTOMY  05/27/2017   Procedure: POLYPECTOMY;  Surgeon: [name], MD;  Location: [facility];  Service: Endoscopy;;   Social History:   reports that he has never smoked. He has never used smokeless tobacco. He reports current alcohol use. He reports that he does not use drugs.  History reviewed. No pertinent family history.  Medications: Patient's Medications  New Prescriptions   No medications on file  Previous Medications   ASPIRIN 81 MG CHEWABLE TABLET    Chew by mouth daily.   CARVEDILOL (COREG) 25 MG TABLET    Take 25 mg by mouth 2 (two) times daily with a meal.   CETIRIZINE (ZYRTEC) 10 MG TABLET    Take 10 mg by mouth daily.   GLIPIZIDE (GLUCOTROL) 10 MG TABLET    Take 10 mg by mouth 2 (two) times daily before a meal.   HYDROCHLOROTHIAZIDE (HYDRODIURIL) 25 MG TABLET    Take 25 mg by mouth daily.   LINAGLIPTIN (TRADJENTA) 5 MG TABS TABLET    Take 5 mg by mouth daily.   LISINOPRIL (PRINIVIL,ZESTRIL) 40 MG TABLET    Take 40 mg by mouth daily.   METFORMIN (  GLUCOPHAGE-XR) 500 MG 24 HR TABLET    Take 1,000 mg by mouth 2 (two) times daily.   MULTIPLE VITAMIN (MULTIVITAMIN) CAPSULE    Take 1 capsule by mouth daily.   SIMVASTATIN (ZOCOR) 20 MG TABLET    Take 20 mg by mouth daily.   TIZANIDINE (ZANAFLEX) 4 MG TABLET    Take 4 mg by mouth 3 (three) times daily. For back pain   TRULICITY 0.17 PZ/0.2HE SOPN    Inject 2 mLs into the skin once a week.  Modified Medications   No medications on file  Discontinued Medications   METFORMIN (GLUCOPHAGE) 500 MG TABLET    Take 500 mg by mouth 2 (two) times daily with a meal.    Physical  Exam:  Vitals:   08/15/20 1016  BP: (!) 142/78  Pulse: 63  Resp: 18  Temp: (!) 97.5 F (36.4 C)  SpO2: 99%  Weight: 261 lb 9.6 oz (118.7 kg)  Height: 6\' 2"  (1.88 m)   Body mass index is 33.59 kg/m. Wt Readings from Last 3 Encounters:  08/15/20 261 lb 9.6 oz (118.7 kg)  07/01/20 259 lb (117.5 kg)  05/27/17 297 lb (134.7 kg)    Physical Exam Vitals reviewed.  Constitutional:      General: He is not in acute distress. HENT:     Head: Normocephalic.  Cardiovascular:     Rate and Rhythm: Normal rate and regular rhythm.     Pulses: Normal pulses.     Heart sounds: Normal heart sounds.  Pulmonary:     Effort: Pulmonary effort is normal. No respiratory distress.     Breath sounds: Normal breath sounds. No wheezing.  Musculoskeletal:     Cervical back: Normal.     Thoracic back: Normal.     Lumbar back: No swelling, deformity or tenderness. Normal range of motion.     Right lower leg: No edema.     Left lower leg: No edema.     Comments: Small dime sized area palpated on lower back above coccyx. Non-tender.   Skin:    General: Skin is warm and dry.     Capillary Refill: Capillary refill takes less than 2 seconds.  Neurological:     General: No focal deficit present.     Mental Status: He is alert and oriented to person, place, and time.  Psychiatric:        Mood and Affect: Mood normal.        Behavior: Behavior normal.    Labs reviewed: Basic Metabolic Panel: Recent Labs    09/08/19 0000 07/01/20 1408  NA 139 138  K 4.7 4.1  CL 105 105  CO2 22 27  GLUCOSE  --  56*  BUN 27* 26*  CREATININE 1.4* 1.34*  CALCIUM 9.6 9.9  TSH 1.00  --    Liver Function Tests: Recent Labs    09/08/19 0000 07/01/20 1408  AST 15 16  ALT 9* 12  ALKPHOS 47  --   BILITOT  --  0.3  PROT  --  7.9  ALBUMIN 4.2  --    No results for input(s): LIPASE, AMYLASE in the last 8760 hours. No results for input(s): AMMONIA in the last 8760 hours. CBC: Recent Labs    09/08/19 0000   WBC 7.3  NEUTROABS 4.00  HGB 10.4*  HCT 32*  PLT 198   Lipid Panel: Recent Labs    09/08/19 0000 07/01/20 1408  CHOL 132 128  HDL 47 52  LDLCALC  64 63  TRIG 120 52  CHOLHDL  --  2.5   TSH: Recent Labs    09/08/19 0000  TSH 1.00   A1C: Lab Results  Component Value Date   HGBA1C 6.0 (H) 07/01/2020     Assessment/Plan 1. Chronic midline low back pain without sciatica - suspect he slept in awkward position, denies strenuous activity - small lump noted above coccyx, nontender - cont tizanidine - recommend tylenol 1000 mg po tid x 7-14 days - cont light walking - avoid heavy lifting, bending and pulling mechanics - gabapentin (NEURONTIN) 100 MG capsule; Take 1 capsule (100 mg total) by mouth at bedtime.  Dispense: 90 capsule; Refill: 3 - may consider xray and PT in future if pain does not resolve  Total time: 21 minutes. Greater than 50% of total time spent doing patient education on symptom management.   Next appt: Visit date not found [place] [phone]

## 2020-08-15 NOTE — Patient Instructions (Signed)
May take muscle relaxer every morning, afternoon and night- please take tylenol 1000 mg every morning and evening (you may given extra dose in afternoon)  Gabapentin ordered for nerve/back pain- take one capsule at night (if after one week no effect you may double dose)   Chronic Back Pain When back pain lasts longer than 3 months, it is called chronic back pain. The cause of your back pain may not be known. Some common causes include:  Wear and tear (degenerative disease) of the bones, ligaments, or disks in your back.  Inflammation and stiffness in your back (arthritis). People who have chronic back pain often go through certain periods in which the pain is more intense (flare-ups). Many people can learn to manage the pain with home care. Follow these instructions at home: Pay attention to any changes in your symptoms. Take these actions to help with your pain: Managing pain and stiffness  If directed, apply ice to the painful area. Your health care provider may recommend applying ice during the first 24-48 hours after a flare-up begins. To do this: ? Put ice in a plastic bag. ? Place a towel between your skin and the bag. ? Leave the ice on for 20 minutes, 2-3 times per day.  If directed, apply heat to the affected area as often as told by your health care provider. Use the heat source that your health care provider recommends, such as a moist heat pack or a heating pad. ? Place a towel between your skin and the heat source. ? Leave the heat on for 20-30 minutes. ? Remove the heat if your skin turns bright red. This is especially important if you are unable to feel pain, heat, or cold. You may have a greater risk of getting burned.  Try soaking in a warm tub.      Activity  Avoid bending and other activities that make the problem worse.  Maintain a proper position when standing or sitting: ? When standing, keep your upper back and neck straight, with your shoulders pulled back.  Avoid slouching. ? When sitting, keep your back straight and relax your shoulders. Do not round your shoulders or pull them backward.  Do not sit or stand in one place for long periods of time.  Take brief periods of rest throughout the day. This will reduce your pain. Resting in a lying or standing position is usually better than sitting to rest.  When you are resting for longer periods, mix in some mild activity or stretching between periods of rest. This will help to prevent stiffness and pain.  Get regular exercise. Ask your health care provider what activities are safe for you.  Do not lift anything that is heavier than 10 lb (4.5 kg), or the limit that you are told, until your health care provider says that it is safe. Always use proper lifting technique, which includes: ? Bending your knees. ? Keeping the load close to your body. ? Avoiding twisting.  Sleep on a firm mattress in a comfortable position. Try lying on your side with your knees slightly bent. If you lie on your back, put a pillow under your knees.   Medicines  Treatment may include medicines for pain and inflammation taken by mouth or applied to the skin, prescription pain medicine, or muscle relaxants. Take over-the-counter and prescription medicines only as told by your health care provider.  Ask your health care provider if the medicine prescribed to you: ? Requires you to avoid  driving or using machinery. ? Can cause constipation. You may need to take these actions to prevent or treat constipation:  Drink enough fluid to keep your urine pale yellow.  Take over-the-counter or prescription medicines.  Eat foods that are high in fiber, such as beans, whole grains, and fresh fruits and vegetables.  Limit foods that are high in fat and processed sugars, such as fried or sweet foods. General instructions  Do not use any products that contain nicotine or tobacco, such as cigarettes, e-cigarettes, and chewing  tobacco. If you need help quitting, ask your health care provider.  Keep all follow-up visits as told by your health care provider. This is important. Contact a health care provider if:  You have pain that is not relieved with rest or medicine.  Your pain gets worse, or you have new pain.  You have a high fever.  You have rapid weight loss.  You have trouble doing your normal activities. Get help right away if:  You have weakness or numbness in one or both of your legs or feet.  You have trouble controlling your bladder or your bowels.  You have severe back pain and have any of the following: ? Nausea or vomiting. ? Pain in your abdomen. ? Shortness of breath or you faint. Summary  Chronic back pain is back pain that lasts longer than 3 months.  When a flare-up begins, apply ice to the painful area for the first 24-48 hours.  Apply a moist heat pad or use a heating pad on the painful area as directed by your health care provider.  When you are resting for longer periods, mix in some mild activity or stretching between periods of rest. This will help to prevent stiffness and pain. This information is not intended to replace advice given to you by your health care provider. Make sure you discuss any questions you have with your health care provider. Document Revised: 04/25/2019 Document Reviewed: 04/25/2019 Elsevier Patient Education  2021 [employer].

## 2020-09-10 ENCOUNTER — Other Ambulatory Visit: Payer: Self-pay

## 2020-09-10 NOTE — Telephone Encounter (Signed)
I had started him on gabapentin in hopes to reduce muscle relaxer use. Can you please ask him?

## 2020-09-10 NOTE — Telephone Encounter (Signed)
Patient called requesting a refill for tizanidine 4 mg tid prn. Patients last visit was 08/15/2020. Please advise. Thank you.  Message routed to Sherrie Mustache, NP

## 2020-09-10 NOTE — Telephone Encounter (Signed)
Is he still having pain, it looks like amy saw him recently for this, will fwd to her for FYI, she may want to advise otherwise or give additional orders

## 2020-09-23 MED ORDER — TIZANIDINE HCL 4 MG PO TABS: 4.0000 mg | ORAL_TABLET | Freq: Three times a day (TID) | ORAL | 0 refills | Status: DC

## 2020-09-23 NOTE — Telephone Encounter (Signed)
Patient called requesting refill on Tizanidine. Patient stated that he has started the Gabapentin but he uses the Tizanidine as needed and request refill.   Pended Rx and sent to Amy for approval.

## 2020-09-23 NOTE — Telephone Encounter (Signed)
I will refill today, please send to Dr. Sabra Heck in future.

## 2020-09-23 NOTE — Addendum Note (Signed)
Addended by: Rafael Bihari A on: 09/23/2020 09:10 AM   Modules accepted: Orders

## 2020-10-03 ENCOUNTER — Other Ambulatory Visit: Payer: Self-pay

## 2020-10-03 NOTE — Telephone Encounter (Signed)
Please verify with patient how much He injects.direction on current orders has two direction for 3 mg and 2 mg ?

## 2020-10-03 NOTE — Telephone Encounter (Signed)
Called patient to verify doses of Trulicity he injects into his skin and no answer. [place].

## 2020-10-03 NOTE — Telephone Encounter (Signed)
Patient called and requested a refill on Trulicity 5.40 MG/ 0.5 ML. Medication never filled by Dr. Sabra Heck, please advise if can refill.

## 2020-10-07 MED ORDER — TRULICITY 0.75 MG/0.5ML ~~LOC~~ SOAJ: 3.0000 mg | SUBCUTANEOUS | 3 refills | Status: DC

## 2020-10-07 NOTE — Telephone Encounter (Signed)
Patient reports that the instructions on the box states to inject 0.75 mg once a week. I advised him to bring px box to next visit for someone to verify the correct doses as well.

## 2020-10-08 ENCOUNTER — Telehealth: Payer: Self-pay | Admitting: *Deleted

## 2020-10-08 MED ORDER — TRULICITY 3 MG/0.5ML ~~LOC~~ SOAJ: SUBCUTANEOUS | 3 refills | Status: DC

## 2020-10-08 NOTE — Telephone Encounter (Signed)
Patient called back and stated that he is taking 78ml weekly.  Updated Rx and sent to pharmacy

## 2020-10-08 NOTE — Telephone Encounter (Signed)
Nathan Honour, MD  You 1 minute ago (8:27 AM)   Let's do whatever insurance will cover

## 2020-10-08 NOTE — Addendum Note (Signed)
Addended by: Rafael Bihari A on: 10/08/2020 12:29 PM   Modules accepted: Orders

## 2020-10-08 NOTE — Telephone Encounter (Signed)
Courtney with [facility] called and wanted to know if they could switch patient's Trulicity From 0.75mg   to 3mg /.30ml pen Inj.   Stated that doing 0.75mg  is going through a full box of 4 pens a week and the insurance will not cover this.   Please Advise

## 2020-10-08 NOTE — Telephone Encounter (Signed)
Pharmacist with [facility] [phone] called to clarify instruction for Trulicity. Pharmacy stated that he is either taking 32ml or .15ml. (not 33ml)   Called patient and left message on Voicemail to return call to clarify directions of medication.  Awaiting call back.

## 2020-10-30 ENCOUNTER — Other Ambulatory Visit: Payer: Self-pay

## 2020-10-30 MED ORDER — HYDROCHLOROTHIAZIDE 25 MG PO TABS: 25.0000 mg | ORAL_TABLET | Freq: Every day | ORAL | 3 refills | Status: DC

## 2020-11-05 ENCOUNTER — Ambulatory Visit: Payer: No Typology Code available for payment source | Admitting: Family Medicine

## 2020-11-05 ENCOUNTER — Other Ambulatory Visit: Payer: Self-pay | Admitting: *Deleted

## 2020-11-05 ENCOUNTER — Encounter: Payer: Self-pay | Admitting: Family Medicine

## 2020-11-05 ENCOUNTER — Other Ambulatory Visit: Payer: Self-pay

## 2020-11-05 VITALS — BP 142/92 | HR 69 | Temp 97.1°F | Ht 74.0 in | Wt 256.6 lb

## 2020-11-05 DIAGNOSIS — M545 Low back pain, unspecified: Secondary | ICD-10-CM | POA: Diagnosis not present

## 2020-11-05 DIAGNOSIS — E119 Type 2 diabetes mellitus without complications: Secondary | ICD-10-CM | POA: Diagnosis not present

## 2020-11-05 DIAGNOSIS — E785 Hyperlipidemia, unspecified: Secondary | ICD-10-CM | POA: Insufficient documentation

## 2020-11-05 DIAGNOSIS — E1169 Type 2 diabetes mellitus with other specified complication: Secondary | ICD-10-CM | POA: Diagnosis not present

## 2020-11-05 DIAGNOSIS — E7841 Elevated Lipoprotein(a): Secondary | ICD-10-CM

## 2020-11-05 DIAGNOSIS — G8929 Other chronic pain: Secondary | ICD-10-CM

## 2020-11-05 MED ORDER — TIZANIDINE HCL 4 MG PO TABS: 4.0000 mg | ORAL_TABLET | Freq: Three times a day (TID) | ORAL | 0 refills | Status: DC

## 2020-11-05 MED ORDER — LINAGLIPTIN 5 MG PO TABS: 5.0000 mg | ORAL_TABLET | Freq: Every day | ORAL | 1 refills | Status: DC

## 2020-11-05 NOTE — Telephone Encounter (Signed)
Patient requested refill to be sent to [facility] instead of the [facility].   Confirmed with Dr. Sabra Heck

## 2020-11-05 NOTE — Patient Instructions (Signed)
Everything looks good today May take gabapentin up to 400 mg for tingling in fingers

## 2020-11-05 NOTE — Progress Notes (Signed)
Provider:  Alain Honey, MD  Careteam: Patient Care Team: Wardell Honour, MD as PCP - General (Family Medicine)  PLACE OF SERVICE:  [place] Directive information    No Known Allergies  Chief Complaint  Patient presents with   Medical Management of Chronic Issues    Patient presents today for 4 month follow-up.     HPI: Patient is a 60 y.o. male basically he has no major complaints.  He was seen here for some back spasms and got a refill on his muscle relaxant. He does report some tingling in his finger tips for which he takes gabapentin 100 mg we discussed increasing that dose as needed.  He denies any foot symptoms.  He is aware that he needs to see ophthalmologist for annual eye exam. We briefly reviewed lab work from last visit in April.  LDL is excellent at 63 as well as his A1c at 6.0.  Review of Systems:  Review of Systems  Constitutional: Negative.   Eyes: Negative.   Respiratory: Negative.    Cardiovascular: Negative.   Neurological:  Negative for tingling and sensory change.  All other systems reviewed and are negative.  Past Medical History:  Diagnosis Date   Arthritis    Class 2 obesity    Diabetes mellitus without complication (HCC)    HLD (hyperlipidemia)    Hypertension    Vitamin D deficiency    Past Surgical History:  Procedure Laterality Date   CIRCUMCISION     COLONOSCOPY WITH PROPOFOL N/A 05/27/2017   Procedure: COLONOSCOPY WITH PROPOFOL;  Surgeon: [name], MD;  Location: [place];  Service: Endoscopy;  Laterality: N/A;  Diabetic   POLYPECTOMY  05/27/2017   Procedure: POLYPECTOMY;  Surgeon: [name], MD;  Location: [place];  Service: Endoscopy;;   Social History:   reports that he has never smoked. He has never used smokeless tobacco. He reports current alcohol use. He reports that he does not use drugs.  History reviewed. No pertinent family history.  Medications: Patient's Medications   New Prescriptions   No medications on file  Previous Medications   ASPIRIN 81 MG CHEWABLE TABLET    Chew by mouth daily.   CARVEDILOL (COREG) 25 MG TABLET    Take 25 mg by mouth 2 (two) times daily with a meal.   CETIRIZINE (ZYRTEC) 10 MG TABLET    Take 10 mg by mouth daily.   DULAGLUTIDE (TRULICITY) 3 0000000 SOPN    Inject 3 mg into the skin once a week.   GABAPENTIN (NEURONTIN) 100 MG CAPSULE    Take 1 capsule (100 mg total) by mouth at bedtime.   GLIPIZIDE (GLUCOTROL) 10 MG TABLET    Take 10 mg by mouth 2 (two) times daily before a meal.   HYDROCHLOROTHIAZIDE (HYDRODIURIL) 25 MG TABLET    Take 1 tablet (25 mg total) by mouth daily.   LINAGLIPTIN (TRADJENTA) 5 MG TABS TABLET    Take 5 mg by mouth daily.   LISINOPRIL (PRINIVIL,ZESTRIL) 40 MG TABLET    Take 40 mg by mouth daily.   METFORMIN (GLUCOPHAGE-XR) 500 MG 24 HR TABLET    Take 1,000 mg by mouth 2 (two) times daily.   MULTIPLE VITAMIN (MULTIVITAMIN) CAPSULE    Take 1 capsule by mouth daily.   SIMVASTATIN (ZOCOR) 20 MG TABLET    Take 20 mg by mouth daily.   TIZANIDINE (ZANAFLEX) 4 MG TABLET    Take 1 tablet (4 mg total)  by mouth 3 (three) times daily. For back pain  Modified Medications   No medications on file  Discontinued Medications   No medications on file    Physical Exam:  Vitals:   11/05/20 0930  BP: (!) 142/92  Pulse: 69  Temp: (!) 97.1 F (36.2 C)  TempSrc: Temporal  SpO2: 98%  Weight: 256 lb 9.6 oz (116.4 kg)  Height: '6\' 2"'$  (1.88 m)   Body mass index is 32.95 kg/m. Wt Readings from Last 3 Encounters:  11/05/20 256 lb 9.6 oz (116.4 kg)  08/15/20 261 lb 9.6 oz (118.7 kg)  07/01/20 259 lb (117.5 kg)    Physical Exam Vitals and nursing note reviewed.  Constitutional:      Appearance: Normal appearance.  Eyes:     Pupils: Pupils are equal, round, and reactive to light.  Cardiovascular:     Rate and Rhythm: Normal rate and regular rhythm.  Pulmonary:     Effort: Pulmonary effort is normal.     Breath  sounds: Normal breath sounds.  Neurological:     General: No focal deficit present.     Mental Status: He is alert and oriented to person, place, and time.    Labs reviewed: Basic Metabolic Panel: Recent Labs    07/01/20 1408  NA 138  K 4.1  CL 105  CO2 27  GLUCOSE 56*  BUN 26*  CREATININE 1.34*  CALCIUM 9.9   Liver Function Tests: Recent Labs    07/01/20 1408  AST 16  ALT 12  BILITOT 0.3  PROT 7.9   No results for input(s): LIPASE, AMYLASE in the last 8760 hours. No results for input(s): AMMONIA in the last 8760 hours. CBC: No results for input(s): WBC, NEUTROABS, HGB, HCT, MCV, PLT in the last 8760 hours. Lipid Panel: Recent Labs    07/01/20 1408  CHOL 128  HDL 52  LDLCALC 63  TRIG 52  CHOLHDL 2.5   TSH: No results for input(s): TSH in the last 8760 hours. A1C: Lab Results  Component Value Date   HGBA1C 6.0 (H) 07/01/2020     Assessment/Plan   1. Chronic midline low back pain without sciatica Not currently a problem but he likes to keep the muscle relaxant on hand for times that it, the pain, flares - tiZANidine (ZANAFLEX) 4 MG tablet; Take 1 tablet (4 mg total) by mouth 3 (three) times daily. For back pain  Dispense: 30 tablet; Refill: 0  2. Type 2 diabetes mellitus with other specified complication, without long-term current use of insulin (HCC) Last A1c was 6.0.  He has lost about 6 pounds.  Hopefully this will continue the good trend - Hemoglobin A1c  3. Diabetes mellitus without complication ([place]) He does have stage III kidney disease.  4. Elevated lipoprotein(a) LDL is excellent on 20 mg of Zocor  Alain Honey, MD [place] [phone]

## 2020-11-06 LAB — HEMOGLOBIN A1C
Hgb A1c MFr Bld: 6 % of total Hgb — ABNORMAL HIGH (ref <5.7)
Mean Plasma Glucose: 126 mg/dL
eAG (mmol/L): 7 mmol/L

## 2020-11-11 ENCOUNTER — Other Ambulatory Visit: Payer: Self-pay | Admitting: *Deleted

## 2020-11-11 MED ORDER — LINAGLIPTIN 5 MG PO TABS
5.0000 mg | ORAL_TABLET | Freq: Every day | ORAL | 1 refills | Status: DC
Start: 2020-11-11 — End: 2020-12-31

## 2020-11-11 NOTE — Telephone Encounter (Signed)
Patient requested refill to be sent to [facility] because [facility] will not accept Rx with it not being one of there providers per patient.

## 2020-12-31 ENCOUNTER — Other Ambulatory Visit: Payer: Self-pay | Admitting: *Deleted

## 2020-12-31 DIAGNOSIS — G8929 Other chronic pain: Secondary | ICD-10-CM

## 2020-12-31 MED ORDER — TIZANIDINE HCL 4 MG PO TABS: 4.0000 mg | ORAL_TABLET | Freq: Three times a day (TID) | ORAL | 0 refills | Status: DC

## 2020-12-31 MED ORDER — LINAGLIPTIN 5 MG PO TABS: 5.0000 mg | ORAL_TABLET | Freq: Every day | ORAL | 1 refills | Status: DC

## 2020-12-31 NOTE — Telephone Encounter (Signed)
Patient requested refill.  Pended Rx and sent to Dr. Sabra Heck for approval.

## 2021-03-10 ENCOUNTER — Other Ambulatory Visit: Payer: Self-pay

## 2021-03-10 ENCOUNTER — Encounter: Payer: Self-pay | Admitting: Family Medicine

## 2021-03-10 ENCOUNTER — Ambulatory Visit: Payer: No Typology Code available for payment source | Admitting: Family Medicine

## 2021-03-10 VITALS — BP 134/74 | HR 63 | Temp 97.7°F | Ht 74.0 in | Wt 255.4 lb

## 2021-03-10 DIAGNOSIS — E1140 Type 2 diabetes mellitus with diabetic neuropathy, unspecified: Secondary | ICD-10-CM

## 2021-03-10 DIAGNOSIS — Z23 Encounter for immunization: Secondary | ICD-10-CM

## 2021-03-10 DIAGNOSIS — E7841 Elevated Lipoprotein(a): Secondary | ICD-10-CM | POA: Diagnosis not present

## 2021-03-10 DIAGNOSIS — E1169 Type 2 diabetes mellitus with other specified complication: Secondary | ICD-10-CM | POA: Diagnosis not present

## 2021-03-10 NOTE — Patient Instructions (Signed)
Recommend follow-up with eye doctor and podiatry

## 2021-03-10 NOTE — Progress Notes (Signed)
Provider:  Alain Honey, MD  Careteam: Patient Care Team: Wardell Honour, MD as PCP - General (Family Medicine)  PLACE OF SERVICE:  [employer] Directive information    No Known Allergies  Chief Complaint  Patient presents with   Medical Management of Chronic Issues    Patient presents today for a 4 month follow-up.   Quality Metric Gaps    Foot & eye exam, Hep C& HIV screening, TDAP, zoster, pneumococcal, Flu, COVID booster     HPI: Patient is a 60 y.o. male Nathan Armstrong is here to follow-up diabetes.  Since his last visit Nathan Armstrong stopped Tradjenta due to affordability.  Nathan Armstrong continues with Trulicity, metformin, and glipizide.  Last A1c was 6.0, 4 months ago. Lipids were last assessed in April of this year. Nathan Armstrong is concerned about callus formation on the plantar surface of both feet.  Review of Systems:  Review of Systems  Constitutional: Negative.   Eyes: Negative.   Respiratory: Negative.    Cardiovascular: Negative.   Genitourinary: Negative.   Musculoskeletal: Negative.   Neurological: Negative.   All other systems reviewed and are negative.  Past Medical History:  Diagnosis Date   Arthritis    Class 2 obesity    Diabetes mellitus without complication (HCC)    HLD (hyperlipidemia)    Hypertension    Vitamin D deficiency    Past Surgical History:  Procedure Laterality Date   CIRCUMCISION     COLONOSCOPY WITH PROPOFOL N/A 05/27/2017   Procedure: COLONOSCOPY WITH PROPOFOL;  Surgeon: [name], MD;  Location: [place];  Service: Endoscopy;  Laterality: N/A;  Diabetic   POLYPECTOMY  05/27/2017   Procedure: POLYPECTOMY;  Surgeon: [name], MD;  Location: [place];  Service: Endoscopy;;   Social History:   reports that Nathan Armstrong has never smoked. Nathan Armstrong has never used smokeless tobacco. Nathan Armstrong reports current alcohol use. Nathan Armstrong reports that Nathan Armstrong does not use drugs.  No family history on file.  Medications: Patient's Medications  New  Prescriptions   No medications on file  Previous Medications   ASPIRIN 81 MG CHEWABLE TABLET    Chew by mouth daily.   CARVEDILOL (COREG) 25 MG TABLET    Take 25 mg by mouth 2 (two) times daily with a meal.   CETIRIZINE (ZYRTEC) 10 MG TABLET    Take 10 mg by mouth daily.   DULAGLUTIDE (TRULICITY) 3 WG/6.6ZL SOPN    Inject 3 mg into the skin once a week.   GABAPENTIN (NEURONTIN) 100 MG CAPSULE    Take 1 capsule (100 mg total) by mouth at bedtime.   GLIPIZIDE (GLUCOTROL) 10 MG TABLET    Take 10 mg by mouth 2 (two) times daily before a meal.   HYDROCHLOROTHIAZIDE (HYDRODIURIL) 25 MG TABLET    Take 1 tablet (25 mg total) by mouth daily.   LINAGLIPTIN (TRADJENTA) 5 MG TABS TABLET    Take 1 tablet (5 mg total) by mouth daily.   LISINOPRIL (PRINIVIL,ZESTRIL) 40 MG TABLET    Take 40 mg by mouth daily.   METFORMIN (GLUCOPHAGE-XR) 500 MG 24 HR TABLET    Take 1,000 mg by mouth 2 (two) times daily.   MULTIPLE VITAMIN (MULTIVITAMIN) CAPSULE    Take 1 capsule by mouth daily.   SIMVASTATIN (ZOCOR) 20 MG TABLET    Take 20 mg by mouth daily.   TIZANIDINE (ZANAFLEX) 4 MG TABLET    Take 1 tablet (4 mg total) by mouth 3 (three) times  daily. For back pain  Modified Medications   No medications on file  Discontinued Medications   No medications on file    Physical Exam:  There were no vitals filed for this visit. There is no height or weight on file to calculate BMI. Wt Readings from Last 3 Encounters:  11/05/20 256 lb 9.6 oz (116.4 kg)  08/15/20 261 lb 9.6 oz (118.7 kg)  07/01/20 259 lb (117.5 kg)    Physical Exam Vitals reviewed.  Constitutional:      Appearance: Normal appearance.  Cardiovascular:     Rate and Rhythm: Normal rate and regular rhythm.  Pulmonary:     Effort: Pulmonary effort is normal.     Breath sounds: Normal breath sounds.  Skin:    Comments: See foot exam under quality metrics  Neurological:     General: No focal deficit present.     Mental Status: Nathan Armstrong is alert and  oriented to person, place, and time.    Labs reviewed: Basic Metabolic Panel: Recent Labs    07/01/20 1408  NA 138  K 4.1  CL 105  CO2 27  GLUCOSE 56*  BUN 26*  CREATININE 1.34*  CALCIUM 9.9   Liver Function Tests: Recent Labs    07/01/20 1408  AST 16  ALT 12  BILITOT 0.3  PROT 7.9   No results for input(s): LIPASE, AMYLASE in the last 8760 hours. No results for input(s): AMMONIA in the last 8760 hours. CBC: No results for input(s): WBC, NEUTROABS, HGB, HCT, MCV, PLT in the last 8760 hours. Lipid Panel: Recent Labs    07/01/20 1408  CHOL 128  HDL 52  LDLCALC 63  TRIG 52  CHOLHDL 2.5   TSH: No results for input(s): TSH in the last 8760 hours. A1C: Lab Results  Component Value Date   HGBA1C 6.0 (H) 11/05/2020     Assessment/Plan  1. Type 2 diabetes mellitus with other specified complication, without long-term current use of insulin (HCC) Will check A1c again today since Nathan Armstrong has now stopped Tradjenta  2. Need for influenza vaccination Use quadrivalent are significant chronic dise  4. Elevated lipoprotein(a) Lipids were at goal when last checked continue with simvastatin   Alain Honey, MD [place] [phone]

## 2021-03-11 ENCOUNTER — Other Ambulatory Visit: Payer: Self-pay

## 2021-03-11 DIAGNOSIS — M545 Low back pain, unspecified: Secondary | ICD-10-CM

## 2021-03-11 DIAGNOSIS — G8929 Other chronic pain: Secondary | ICD-10-CM

## 2021-03-11 LAB — HEMOGLOBIN A1C
Hgb A1c MFr Bld: 6 % of total Hgb — ABNORMAL HIGH (ref <5.7)
Mean Plasma Glucose: 126 mg/dL
eAG (mmol/L): 7 mmol/L

## 2021-03-11 MED ORDER — TIZANIDINE HCL 4 MG PO TABS: 4.0000 mg | ORAL_TABLET | Freq: Three times a day (TID) | ORAL | 5 refills | Status: DC

## 2021-03-17 ENCOUNTER — Other Ambulatory Visit: Payer: Self-pay

## 2021-03-17 ENCOUNTER — Ambulatory Visit: Payer: No Typology Code available for payment source | Admitting: Podiatry

## 2021-03-17 DIAGNOSIS — E0843 Diabetes mellitus due to underlying condition with diabetic autonomic (poly)neuropathy: Secondary | ICD-10-CM | POA: Diagnosis not present

## 2021-03-17 DIAGNOSIS — M2141 Flat foot [pes planus] (acquired), right foot: Secondary | ICD-10-CM

## 2021-03-17 DIAGNOSIS — M2142 Flat foot [pes planus] (acquired), left foot: Secondary | ICD-10-CM

## 2021-03-17 DIAGNOSIS — L989 Disorder of the skin and subcutaneous tissue, unspecified: Secondary | ICD-10-CM | POA: Diagnosis not present

## 2021-03-17 DIAGNOSIS — M2012 Hallux valgus (acquired), left foot: Secondary | ICD-10-CM

## 2021-03-17 DIAGNOSIS — M2011 Hallux valgus (acquired), right foot: Secondary | ICD-10-CM

## 2021-03-17 NOTE — Progress Notes (Signed)
° °  HPI: 60 y.o. male presenting today as a new patient referral from his PCP for evaluation of a symptomatic skin lesion to the right forefoot.  Patient does have history of diabetes mellitus and also presents for routine diabetic foot exam.  He has had pedicures to try and alleviate the pain to the callused area with minimal improvement.  He presents for further treatment and evaluation  Past Medical History:  Diagnosis Date   Arthritis    Class 2 obesity    Diabetes mellitus without complication (HCC)    HLD (hyperlipidemia)    Hypertension    Vitamin D deficiency     Past Surgical History:  Procedure Laterality Date   CIRCUMCISION     COLONOSCOPY WITH PROPOFOL N/A 05/27/2017   Procedure: COLONOSCOPY WITH PROPOFOL;  Surgeon: [name], MD;  Location: [place];  Service: Endoscopy;  Laterality: N/A;  Diabetic   POLYPECTOMY  05/27/2017   Procedure: POLYPECTOMY;  Surgeon: [name], MD;  Location: [place];  Service: Endoscopy;;    No Known Allergies   Physical Exam: General: The patient is alert and oriented x3 in no acute distress.  Dermatology: Skin is warm, dry and supple bilateral lower extremities. Negative for open lesions or macerations.  Vascular: Palpable pedal pulses bilaterally. Capillary refill within normal limits.  Negative for any significant edema or erythema  Neurological: Light touch and protective threshold diminished intact  Musculoskeletal Exam: Collapse of the medial longitudinal arch noted with limited range of motion to the midtarsal and subtalar joint.  Clinical evidence of a hallux valgus deformity also noted bilateral  Assessment: 1.  Diabetes mellitus with peripheral polyneuropathy 2.  Pes planovalgus bilateral 3.  Hallux valgus bilateral 4.  Symptomatic benign skin lesion/preulcerative callus right forefoot   Plan of Care:  1. Patient evaluated.  Comprehensive diabetic foot exam performed today 2.  Excisional debridement  of the hyperkeratotic preulcerative callus was performed using a 312 scalpel without incident or bleeding 3.  Recommend OTC corn callus remover as needed 4.  Appointment with Pedorthist for diabetic shoes and insoles 5.  Return to clinic annually  *Works on his feet 12-hour shifts.  Goes by Will      Edrick Kins, DPM [facility]  Dr. Edrick Kins, DPM    [address], [place] [address]                Office [phone]  Fax [phone]

## 2021-03-23 ENCOUNTER — Other Ambulatory Visit: Payer: Self-pay | Admitting: Family Medicine

## 2021-03-27 ENCOUNTER — Ambulatory Visit: Payer: No Typology Code available for payment source

## 2021-03-27 ENCOUNTER — Other Ambulatory Visit: Payer: Self-pay

## 2021-03-27 DIAGNOSIS — M2141 Flat foot [pes planus] (acquired), right foot: Secondary | ICD-10-CM

## 2021-03-27 NOTE — Progress Notes (Signed)
Patient seen for consult for shoes and insoles. Discussed private insurance coverage and potential out of pocket cost. Patient wants to contact his insurance company to determine coverage of shoes and insoles before proceeding. Provided relevant documentation. All questions answered and concerns addressed.

## 2021-05-18 ENCOUNTER — Other Ambulatory Visit: Payer: Self-pay | Admitting: *Deleted

## 2021-05-18 MED ORDER — SIMVASTATIN 20 MG PO TABS: 20.0000 mg | ORAL_TABLET | Freq: Every day | ORAL | 1 refills | Status: DC

## 2021-05-18 NOTE — Telephone Encounter (Signed)
Patient requested refill

## 2021-06-18 LAB — HM DIABETES EYE EXAM

## 2021-07-08 ENCOUNTER — Other Ambulatory Visit: Payer: Self-pay

## 2021-07-08 MED ORDER — CETIRIZINE HCL 10 MG PO TABS: 10.0000 mg | ORAL_TABLET | Freq: Every day | ORAL | 3 refills | Status: DC

## 2021-07-20 ENCOUNTER — Telehealth: Payer: Self-pay

## 2021-07-20 MED ORDER — CARVEDILOL 25 MG PO TABS: 25.0000 mg | ORAL_TABLET | Freq: Two times a day (BID) | ORAL | 2 refills | Status: DC

## 2021-07-20 MED ORDER — LISINOPRIL 40 MG PO TABS: 40.0000 mg | ORAL_TABLET | Freq: Every day | ORAL | 1 refills | Status: DC

## 2021-07-20 NOTE — Telephone Encounter (Signed)
Patient called for refill request for his Lisinopril 40 mg and Coreg '25mg'$ . Medication send into pharmacy. ?

## 2021-09-10 ENCOUNTER — Other Ambulatory Visit: Payer: Self-pay | Admitting: Orthopedic Surgery

## 2021-09-10 ENCOUNTER — Other Ambulatory Visit: Payer: Self-pay | Admitting: Family Medicine

## 2021-09-10 DIAGNOSIS — G8929 Other chronic pain: Secondary | ICD-10-CM

## 2021-09-24 LAB — HM DIABETES EYE EXAM

## 2021-10-13 ENCOUNTER — Other Ambulatory Visit: Payer: Self-pay | Admitting: Family Medicine

## 2021-10-13 MED ORDER — TRULICITY 3 MG/0.5ML ~~LOC~~ SOAJ: SUBCUTANEOUS | 0 refills | Status: DC

## 2021-10-13 NOTE — Telephone Encounter (Signed)
He needs follow up appt, okay to approve with scheduled appt.

## 2021-10-13 NOTE — Telephone Encounter (Signed)
Patient called and appointment scheduled for August 2nd with PCP Wardell Honour, MD . Patient also requested refill on Trulicity. Patient given one month supply of both medications. Further refills will be approved by PCP during visit. Message routed back to Sherrie Mustache, NP as Juluis Rainier. No further action is required.

## 2021-10-13 NOTE — Telephone Encounter (Signed)
Patient has request refill on medication Hydrochlorothiazide '25mg'$ . Patient last refill dated 09/10/2021. Patient was only given 30 day supply by PCP Nathan Honour, MD with no refills. I'm unsure why. Medication pend and sent to Sherrie Mustache, NP due to PCP being out of office. Please advise.

## 2021-10-23 ENCOUNTER — Other Ambulatory Visit: Payer: Self-pay

## 2021-10-23 MED ORDER — METFORMIN HCL ER 500 MG PO TB24: 1000.0000 mg | ORAL_TABLET | Freq: Two times a day (BID) | ORAL | 1 refills | Status: DC

## 2021-10-28 ENCOUNTER — Ambulatory Visit: Payer: No Typology Code available for payment source | Admitting: Family Medicine

## 2021-10-28 ENCOUNTER — Encounter: Payer: Self-pay | Admitting: Family Medicine

## 2021-10-28 VITALS — BP 146/82 | HR 55 | Temp 97.8°F | Ht 74.0 in | Wt 259.2 lb

## 2021-10-28 DIAGNOSIS — E1140 Type 2 diabetes mellitus with diabetic neuropathy, unspecified: Secondary | ICD-10-CM | POA: Diagnosis not present

## 2021-10-28 DIAGNOSIS — E7841 Elevated Lipoprotein(a): Secondary | ICD-10-CM | POA: Diagnosis not present

## 2021-10-28 DIAGNOSIS — E1169 Type 2 diabetes mellitus with other specified complication: Secondary | ICD-10-CM

## 2021-10-28 DIAGNOSIS — I10 Essential (primary) hypertension: Secondary | ICD-10-CM | POA: Insufficient documentation

## 2021-10-28 MED ORDER — GABAPENTIN 300 MG PO CAPS: 300.0000 mg | ORAL_CAPSULE | Freq: Two times a day (BID) | ORAL | 3 refills | Status: DC

## 2021-10-28 NOTE — Patient Instructions (Signed)
May try Fibro as cream on your hands

## 2021-10-28 NOTE — Progress Notes (Signed)
Provider:  Alain Honey, MD  Careteam: Patient Care Team: Wardell Honour, MD as PCP - General (Family Medicine)  PLACE OF SERVICE:  [place] Directive information    No Known Allergies  Chief Complaint  Patient presents with   Medical Management of Chronic Issues    Patient presents today for a 7 month follow-up.   Quality Metric Gaps    A1C, eye exam, HIV & Hep C screening, TDAP, zoster, COVID booster #4     HPI: Patient is a 61 y.o. male patient is here for follow-up of chronic problems including diabetes, neuropathy, hyperlipidemia, patient has seen eye doctor within the last month and got what he describes as a normal exam.  He does have some neuropathy in his hands.  Almost sounds like carpal tunnel but Phalen and Finkelstein's tests are both negative so it may be due to the diabetes.  Last U2P was 6.0 on Trulicity metformin and glipizide.  Weight is stable.  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  Review of Systems:  Review of Systems  Constitutional: Negative.   Eyes: Negative.   Respiratory: Negative.    Cardiovascular: Negative.   Genitourinary: Negative.   Musculoskeletal: Negative.   Neurological: Negative.   Psychiatric/Behavioral: Negative.    All other systems reviewed and are negative.   Past Medical History:  Diagnosis Date   Arthritis    Class 2 obesity    Diabetes mellitus without complication (HCC)    HLD (hyperlipidemia)    Hypertension    Vitamin D deficiency    Past Surgical History:  Procedure Laterality Date   CIRCUMCISION     COLONOSCOPY WITH PROPOFOL N/A 05/27/2017   Procedure: COLONOSCOPY WITH PROPOFOL;  Surgeon: [name], MD;  Location: [place];  Service: Endoscopy;  Laterality: N/A;  Diabetic   POLYPECTOMY  05/27/2017   Procedure: POLYPECTOMY;  Surgeon: [name], MD;  Location: [place];  Service: Endoscopy;;   Social History:   reports that he has never smoked. He has  never used smokeless tobacco. He reports current alcohol use. He reports that he does not use drugs.  No family history on file.  Medications: Patient's Medications  New Prescriptions   GABAPENTIN (NEURONTIN) 300 MG CAPSULE    Take 1 capsule (300 mg total) by mouth 2 (two) times daily.  Previous Medications   ASPIRIN 81 MG CHEWABLE TABLET    Chew by mouth daily.   CARVEDILOL (COREG) 25 MG TABLET    Take 1 tablet (25 mg total) by mouth 2 (two) times daily with a meal.   CETIRIZINE (ZYRTEC) 10 MG TABLET    Take 1 tablet (10 mg total) by mouth daily.   DULAGLUTIDE (TRULICITY) 3 NT/6.1WE SOPN    Inject 3 mg into the skin once a week.   GLIPIZIDE (GLUCOTROL) 10 MG TABLET    Take 10 mg by mouth 2 (two) times daily before a meal.   HYDROCHLOROTHIAZIDE (HYDRODIURIL) 25 MG TABLET    Take 1 tablet by mouth once daily   LISINOPRIL (ZESTRIL) 40 MG TABLET    Take 1 tablet (40 mg total) by mouth daily.   METFORMIN (GLUCOPHAGE-XR) 500 MG 24 HR TABLET    Take 2 tablets (1,000 mg total) by mouth 2 (two) times daily.   MULTIPLE VITAMIN (MULTIVITAMIN) CAPSULE    Take 1 capsule by mouth daily.   SIMVASTATIN (ZOCOR) 20 MG TABLET    Take 1 tablet (20  mg total) by mouth daily.   TIZANIDINE (ZANAFLEX) 4 MG TABLET    TAKE 1 TABLET BY MOUTH THREE TIMES DAILY FOR BACK PAIN  Modified Medications   No medications on file  Discontinued Medications   GABAPENTIN (NEURONTIN) 100 MG CAPSULE    Take 1 capsule by mouth at bedtime    Physical Exam:  Vitals:   10/28/21 1005  BP: (!) 146/82  Pulse: (!) 55  Temp: 97.8 F (36.6 C)  SpO2: 98%  Weight: 259 lb 3.2 oz (117.6 kg)  Height: '6\' 2"'$  (1.88 m)   Body mass index is 33.28 kg/m. Wt Readings from Last 3 Encounters:  10/28/21 259 lb 3.2 oz (117.6 kg)  03/10/21 255 lb 6.4 oz (115.8 kg)  11/05/20 256 lb 9.6 oz (116.4 kg)    Physical Exam Vitals and nursing note reviewed.  Constitutional:      Appearance: Normal appearance.  Neck:     Vascular: No carotid  bruit.  Cardiovascular:     Rate and Rhythm: Normal rate and regular rhythm.  Pulmonary:     Effort: Pulmonary effort is normal.     Breath sounds: Normal breath sounds.  Musculoskeletal:        General: Normal range of motion.     Cervical back: Normal range of motion.  Neurological:     General: No focal deficit present.     Mental Status: He is alert and oriented to person, place, and time.  Psychiatric:        Mood and Affect: Mood normal.        Behavior: Behavior normal.     Labs reviewed: Basic Metabolic Panel: No results for input(s): "NA", "K", "CL", "CO2", "GLUCOSE", "BUN", "CREATININE", "CALCIUM", "MG", "PHOS", "TSH" in the last 8760 hours. Liver Function Tests: No results for input(s): "AST", "ALT", "ALKPHOS", "BILITOT", "PROT", "ALBUMIN" in the last 8760 hours. No results for input(s): "LIPASE", "AMYLASE" in the last 8760 hours. No results for input(s): "AMMONIA" in the last 8760 hours. CBC: No results for input(s): "WBC", "NEUTROABS", "HGB", "HCT", "MCV", "PLT" in the last 8760 hours. Lipid Panel: No results for input(s): "CHOL", "HDL", "LDLCALC", "TRIG", "CHOLHDL", "LDLDIRECT" in the last 8760 hours. TSH: No results for input(s): "TSH" in the last 8760 hours. A1C: Lab Results  Component Value Date   HGBA1C 6.0 (H) 03/10/2021     Assessment/Plan  1. Type 2 diabetes mellitus with other specified complication, without long-term current use of insulin (HCC) A1c's show good control on current regimen We will increase gabapentin from 300 a day that he currently takes to 300 twice daily and titrate accordingly to try and improve neuropathy in hands  2. Elevated lipoprotein(a) Lipids were last assessed 1 year ago and LDL was 63.  Currently takes simvastatin 20 mg     4. Primary hypertension Blood pressure is good at 146/82 on lisinopril 40 mg and hydrochlorothiazide   Alain Honey, MD [facility] [phone]

## 2021-10-29 LAB — COMPLETE METABOLIC PANEL WITH GFR
AG Ratio: 0.9 (calc) — ABNORMAL LOW (ref 1.0–2.5)
ALT: 13 U/L (ref 9–46)
AST: 14 U/L (ref 10–35)
Albumin: 3.7 g/dL (ref 3.6–5.1)
Alkaline phosphatase (APISO): 50 U/L (ref 35–144)
BUN: 19 mg/dL (ref 7–25)
CO2: 26 mmol/L (ref 20–32)
Calcium: 9.4 mg/dL (ref 8.6–10.3)
Chloride: 108 mmol/L (ref 98–110)
Creat: 1.31 mg/dL (ref 0.70–1.35)
Globulin: 4.1 g/dL (calc) — ABNORMAL HIGH (ref 1.9–3.7)
Glucose, Bld: 82 mg/dL (ref 65–99)
Potassium: 4.2 mmol/L (ref 3.5–5.3)
Sodium: 138 mmol/L (ref 135–146)
Total Bilirubin: 0.2 mg/dL (ref 0.2–1.2)
Total Protein: 7.8 g/dL (ref 6.1–8.1)
eGFR: 62 mL/min/{1.73_m2} (ref >=60)

## 2021-10-29 LAB — LIPID PANEL
Cholesterol: 110 mg/dL (ref <200)
HDL: 50 mg/dL (ref >=40)
LDL Cholesterol (Calc): 47 mg/dL (calc)
Non-HDL Cholesterol (Calc): 60 mg/dL (calc) (ref <130)
Total CHOL/HDL Ratio: 2.2 (calc) (ref <5.0)
Triglycerides: 44 mg/dL (ref <150)

## 2021-10-29 LAB — HEMOGLOBIN A1C
Hgb A1c MFr Bld: 6.1 % of total Hgb — ABNORMAL HIGH (ref <5.7)
Mean Plasma Glucose: 128 mg/dL
eAG (mmol/L): 7.1 mmol/L

## 2021-11-15 ENCOUNTER — Other Ambulatory Visit: Payer: Self-pay | Admitting: Family Medicine

## 2021-11-15 DIAGNOSIS — G8929 Other chronic pain: Secondary | ICD-10-CM

## 2021-11-15 DIAGNOSIS — M545 Low back pain, unspecified: Secondary | ICD-10-CM

## 2021-12-22 ENCOUNTER — Telehealth: Payer: Self-pay

## 2021-12-22 DIAGNOSIS — E1169 Type 2 diabetes mellitus with other specified complication: Secondary | ICD-10-CM

## 2021-12-22 MED ORDER — GLIPIZIDE 10 MG PO TABS: 10.0000 mg | ORAL_TABLET | Freq: Two times a day (BID) | ORAL | 1 refills | Status: DC

## 2021-12-22 NOTE — Telephone Encounter (Signed)
Patient called for a rx on Glipizide 10 mg and medication was send into pharmacy.

## 2022-01-09 ENCOUNTER — Other Ambulatory Visit: Payer: Self-pay | Admitting: Family Medicine

## 2022-01-13 ENCOUNTER — Other Ambulatory Visit: Payer: Self-pay | Admitting: Family Medicine

## 2022-03-02 ENCOUNTER — Other Ambulatory Visit: Payer: Self-pay | Admitting: Family Medicine

## 2022-03-11 ENCOUNTER — Other Ambulatory Visit: Payer: Self-pay | Admitting: Family Medicine

## 2022-03-11 DIAGNOSIS — M545 Low back pain, unspecified: Secondary | ICD-10-CM

## 2022-04-23 ENCOUNTER — Other Ambulatory Visit: Payer: Self-pay | Admitting: Family Medicine

## 2022-04-23 DIAGNOSIS — M545 Low back pain, unspecified: Secondary | ICD-10-CM

## 2022-05-04 ENCOUNTER — Ambulatory Visit: Payer: No Typology Code available for payment source | Admitting: Family Medicine

## 2022-05-05 ENCOUNTER — Ambulatory Visit: Payer: No Typology Code available for payment source | Admitting: Family Medicine

## 2022-06-09 ENCOUNTER — Ambulatory Visit: Payer: No Typology Code available for payment source | Admitting: Family Medicine

## 2022-06-17 ENCOUNTER — Other Ambulatory Visit: Payer: Self-pay | Admitting: Family Medicine

## 2022-06-17 DIAGNOSIS — E1169 Type 2 diabetes mellitus with other specified complication: Secondary | ICD-10-CM

## 2022-06-30 ENCOUNTER — Encounter: Payer: Self-pay | Admitting: Family Medicine

## 2022-06-30 ENCOUNTER — Ambulatory Visit (INDEPENDENT_AMBULATORY_CARE_PROVIDER_SITE_OTHER): Payer: No Typology Code available for payment source | Admitting: Family Medicine

## 2022-06-30 VITALS — BP 128/88 | HR 65 | Temp 97.6°F | Resp 18 | Ht 74.0 in | Wt 248.4 lb

## 2022-06-30 DIAGNOSIS — N400 Enlarged prostate without lower urinary tract symptoms: Secondary | ICD-10-CM

## 2022-06-30 DIAGNOSIS — E7841 Elevated Lipoprotein(a): Secondary | ICD-10-CM | POA: Diagnosis not present

## 2022-06-30 DIAGNOSIS — E1169 Type 2 diabetes mellitus with other specified complication: Secondary | ICD-10-CM | POA: Diagnosis not present

## 2022-06-30 DIAGNOSIS — I10 Essential (primary) hypertension: Secondary | ICD-10-CM

## 2022-06-30 DIAGNOSIS — E1140 Type 2 diabetes mellitus with diabetic neuropathy, unspecified: Secondary | ICD-10-CM

## 2022-06-30 NOTE — Progress Notes (Signed)
Provider:  Alain Honey, MD  Careteam: Patient Care Team: Wardell Honour, MD as PCP - General (Family Medicine)  PLACE OF SERVICE:  [place] Directive information    No Known Allergies  Chief Complaint  Patient presents with   Medical Management of Chronic Issues    Routine Visit   Quality Metric Gaps    Needs to discuss eye and foot exam, colonoscopy, HIV and Hepatitis C Screening, Shingrix Vaccine, COVID 19 Vaccine, TDAP vaccine, Hemoglobin A1C & Diabetic kidney evaluation.         HPI: Patient is a 62 y.o. male patient is here to follow-up diabetes and hyperlipidemia.  Continues to take combination regimen for diabetes: Trulicity, glipizide, and metformin XR.  A1c's have generally been good and his last one 6 months ago was 6.1.  For his blood pressure takes combination lisinopril and carvedilol and also takes simvastatin for his lipids.  Anniversary to check lipids will be in 4 months so we will wait on that today.  Has upcoming appointment with eye doctor.  Had colonoscopy 4 to 5 years ago. There are no other complaints today  Review of Systems:  Review of Systems  Constitutional: Negative.   HENT: Negative.    Eyes: Negative.   Respiratory: Negative.    Cardiovascular: Negative.   Gastrointestinal: Negative.   Genitourinary: Negative.   Musculoskeletal: Negative.   Neurological: Negative.   Psychiatric/Behavioral: Negative.    All other systems reviewed and are negative.   Past Medical History:  Diagnosis Date   Arthritis    Class 2 obesity    Diabetes mellitus without complication (HCC)    HLD (hyperlipidemia)    Hypertension    Vitamin D deficiency    Past Surgical History:  Procedure Laterality Date   CIRCUMCISION     COLONOSCOPY WITH PROPOFOL N/A 05/27/2017   Procedure: COLONOSCOPY WITH PROPOFOL;  Surgeon: [name], MD;  Location: [place];  Service: Endoscopy;  Laterality: N/A;  Diabetic   POLYPECTOMY  05/27/2017    Procedure: POLYPECTOMY;  Surgeon: [name], MD;  Location: [place];  Service: Endoscopy;;   Social History:   reports that he has never smoked. He has never used smokeless tobacco. He reports current alcohol use. He reports that he does not use drugs.  No family history on file.  Medications: Patient's Medications  New Prescriptions   No medications on file  Previous Medications   ASPIRIN 81 MG CHEWABLE TABLET    Chew by mouth daily.   CARVEDILOL (COREG) 25 MG TABLET    Take 1 tablet (25 mg total) by mouth 2 (two) times daily with a meal.   CETIRIZINE (ZYRTEC) 10 MG TABLET    Take 1 tablet (10 mg total) by mouth daily.   DULAGLUTIDE (TRULICITY) 3 0000000 SOPN    INJECT 3MG  SUBCUTANEOUSLY ONCE A WEEK   GABAPENTIN (NEURONTIN) 300 MG CAPSULE    Take 1 capsule by mouth twice daily   GLIPIZIDE (GLUCOTROL) 10 MG TABLET    Take 1 tablet (10 mg total) by mouth 2 (two) times daily before a meal.   HYDROCHLOROTHIAZIDE (HYDRODIURIL) 25 MG TABLET    Take 1 tablet by mouth once daily   LISINOPRIL (ZESTRIL) 40 MG TABLET    Take 1 tablet by mouth once daily   METFORMIN (GLUCOPHAGE-XR) 500 MG 24 HR TABLET    Take 2 tablets (1,000 mg total) by mouth 2 (two) times daily.   MULTIPLE VITAMIN (MULTIVITAMIN) CAPSULE  Take 1 capsule by mouth daily.   SIMVASTATIN (ZOCOR) 20 MG TABLET    Take 1 tablet by mouth once daily   TIZANIDINE (ZANAFLEX) 4 MG TABLET    TAKE 1 TABLET BY MOUTH THREE TIMES DAILY FOR BACK PAIN  Modified Medications   No medications on file  Discontinued Medications   No medications on file    Physical Exam:  There were no vitals filed for this visit. There is no height or weight on file to calculate BMI. Wt Readings from Last 3 Encounters:  10/28/21 259 lb 3.2 oz (117.6 kg)  03/10/21 255 lb 6.4 oz (115.8 kg)  11/05/20 256 lb 9.6 oz (116.4 kg)    Physical Exam Vitals and nursing note reviewed.  Constitutional:      Appearance: Normal appearance.  HENT:      Head: Normocephalic.  Cardiovascular:     Rate and Rhythm: Normal rate and regular rhythm.  Pulmonary:     Effort: Pulmonary effort is normal.     Breath sounds: Normal breath sounds.  Abdominal:     General: Bowel sounds are normal.     Palpations: Abdomen is soft.  Musculoskeletal:        General: Normal range of motion.  Skin:    General: Skin is warm and dry.  Neurological:     General: No focal deficit present.     Mental Status: He is oriented to person, place, and time.     Labs reviewed: Basic Metabolic Panel: Recent Labs    10/28/21 1041  NA 138  K 4.2  CL 108  CO2 26  GLUCOSE 82  BUN 19  CREATININE 1.31  CALCIUM 9.4   Liver Function Tests: Recent Labs    10/28/21 1041  AST 14  ALT 13  BILITOT 0.2  PROT 7.8   No results for input(s): "LIPASE", "AMYLASE" in the last 8760 hours. No results for input(s): "AMMONIA" in the last 8760 hours. CBC: No results for input(s): "WBC", "NEUTROABS", "HGB", "HCT", "MCV", "PLT" in the last 8760 hours. Lipid Panel: Recent Labs    10/28/21 1041  CHOL 110  HDL 50  LDLCALC 47  TRIG 44  CHOLHDL 2.2   TSH: No results for input(s): "TSH" in the last 8760 hours. A1C: Lab Results  Component Value Date   HGBA1C 6.1 (H) 10/28/2021     Assessment/Plan 1. Type 2 diabetes mellitus with other specified complication, without long-term current use of insulin Will repeat A1c today but he generally does very well.  His weight is down some 11 pounds from last visit  2. Benign prostatic hyperplasia without lower urinary tract symptoms Negative family history for prostatitis but I think PSA is appropriate given African-American  3. Elevated lipoprotein(a) LDL and HDL and total cholesterol was good when checked 6 months ago  4. Primary hypertension Blood pressure borderline elevated today.  Continue with lisinopril and carvedilol      Alain Honey, MD [place] [phone]

## 2022-07-01 LAB — BASIC METABOLIC PANEL WITH GFR
BUN/Creatinine Ratio: 19 (calc) (ref 6–22)
BUN: 30 mg/dL — ABNORMAL HIGH (ref 7–25)
CO2: 25 mmol/L (ref 20–32)
Calcium: 9.9 mg/dL (ref 8.6–10.3)
Chloride: 104 mmol/L (ref 98–110)
Creat: 1.56 mg/dL — ABNORMAL HIGH (ref 0.70–1.35)
Glucose, Bld: 80 mg/dL (ref 65–99)
Potassium: 4.5 mmol/L (ref 3.5–5.3)
Sodium: 138 mmol/L (ref 135–146)
eGFR: 50 mL/min/{1.73_m2} — ABNORMAL LOW (ref >=60)

## 2022-07-01 LAB — HEMOGLOBIN A1C
Hgb A1c MFr Bld: 6.8 % of total Hgb — ABNORMAL HIGH (ref <5.7)
Mean Plasma Glucose: 148 mg/dL
eAG (mmol/L): 8.2 mmol/L

## 2022-07-01 LAB — PSA: PSA: 2.37 ng/mL (ref <=4.00)

## 2022-07-02 ENCOUNTER — Other Ambulatory Visit: Payer: Self-pay

## 2022-07-02 DIAGNOSIS — E1169 Type 2 diabetes mellitus with other specified complication: Secondary | ICD-10-CM

## 2022-07-02 MED ORDER — GABAPENTIN 300 MG PO CAPS
300.0000 mg | ORAL_CAPSULE | Freq: Two times a day (BID) | ORAL | 0 refills | Status: DC
Start: 2022-07-02 — End: 2022-09-27

## 2022-07-02 MED ORDER — CETIRIZINE HCL 10 MG PO TABS: 10.0000 mg | ORAL_TABLET | Freq: Every day | ORAL | 1 refills | Status: DC

## 2022-07-11 ENCOUNTER — Other Ambulatory Visit: Payer: Self-pay | Admitting: Family Medicine

## 2022-07-11 DIAGNOSIS — M545 Low back pain, unspecified: Secondary | ICD-10-CM

## 2022-09-10 ENCOUNTER — Other Ambulatory Visit: Payer: Self-pay | Admitting: Family Medicine

## 2022-09-25 ENCOUNTER — Other Ambulatory Visit: Payer: Self-pay | Admitting: Family Medicine

## 2022-09-25 DIAGNOSIS — E1169 Type 2 diabetes mellitus with other specified complication: Secondary | ICD-10-CM

## 2022-11-05 ENCOUNTER — Other Ambulatory Visit: Payer: Self-pay | Admitting: Family Medicine

## 2022-11-05 DIAGNOSIS — E1169 Type 2 diabetes mellitus with other specified complication: Secondary | ICD-10-CM

## 2022-11-25 ENCOUNTER — Other Ambulatory Visit: Payer: Self-pay | Admitting: Family Medicine

## 2022-11-29 ENCOUNTER — Other Ambulatory Visit: Payer: Self-pay | Admitting: Family Medicine

## 2022-11-29 DIAGNOSIS — E1169 Type 2 diabetes mellitus with other specified complication: Secondary | ICD-10-CM

## 2022-12-01 ENCOUNTER — Ambulatory Visit: Payer: No Typology Code available for payment source | Admitting: Sports Medicine

## 2022-12-07 ENCOUNTER — Ambulatory Visit: Payer: No Typology Code available for payment source | Admitting: Sports Medicine

## 2022-12-07 ENCOUNTER — Encounter: Payer: Self-pay | Admitting: Sports Medicine

## 2022-12-07 VITALS — BP 144/80 | HR 56 | Temp 97.8°F | Resp 17 | Ht 74.0 in | Wt 241.8 lb

## 2022-12-07 DIAGNOSIS — G8929 Other chronic pain: Secondary | ICD-10-CM

## 2022-12-07 DIAGNOSIS — Z23 Encounter for immunization: Secondary | ICD-10-CM

## 2022-12-07 DIAGNOSIS — I10 Essential (primary) hypertension: Secondary | ICD-10-CM

## 2022-12-07 DIAGNOSIS — G629 Polyneuropathy, unspecified: Secondary | ICD-10-CM

## 2022-12-07 DIAGNOSIS — E785 Hyperlipidemia, unspecified: Secondary | ICD-10-CM

## 2022-12-07 DIAGNOSIS — E1169 Type 2 diabetes mellitus with other specified complication: Secondary | ICD-10-CM | POA: Diagnosis not present

## 2022-12-07 DIAGNOSIS — E559 Vitamin D deficiency, unspecified: Secondary | ICD-10-CM

## 2022-12-07 DIAGNOSIS — M25511 Pain in right shoulder: Secondary | ICD-10-CM

## 2022-12-07 DIAGNOSIS — N1831 Chronic kidney disease, stage 3a: Secondary | ICD-10-CM

## 2022-12-07 DIAGNOSIS — B351 Tinea unguium: Secondary | ICD-10-CM

## 2022-12-07 MED ORDER — GABAPENTIN 300 MG PO CAPS
300.0000 mg | ORAL_CAPSULE | Freq: Three times a day (TID) | ORAL | 3 refills | Status: DC
Start: 2022-12-07 — End: 2023-04-25

## 2022-12-07 MED ORDER — METFORMIN HCL ER 500 MG PO TB24
1000.0000 mg | ORAL_TABLET | Freq: Every day | ORAL | 1 refills | Status: DC
Start: 2022-12-07 — End: 2023-10-31

## 2022-12-07 MED ORDER — GLIPIZIDE 5 MG PO TABS
5.0000 mg | ORAL_TABLET | Freq: Every day | ORAL | 3 refills | Status: DC
Start: 2022-12-07 — End: 2023-06-14

## 2022-12-07 NOTE — Progress Notes (Signed)
Careteam: Patient Care Team: Venita Sheffield, MD as PCP - General (Internal Medicine)  PLACE OF SERVICE:  [facility]  Advanced Directive information Does Patient Have a Medical Advance Directive?: No, Would patient like information on creating a medical advance directive?: No - Patient declined  No Known Allergies  Chief Complaint  Patient presents with   Medical Management of Chronic Issues    5 month follow up.   Health Maintenance    Discuss the need for HIV Screening, Eye exam, Hepatitis C Screening, Colonoscopy, and Diabetic kidney evaluation.   Immunizations    Discuss the need for Influenza vaccine, and Covid Booster. [facility] Verified.     HPI: Patient is a 62 y.o. male is here for follow up  HTN  high  States he takes coreg only once daily On lisinopril, coreg, hydrochlorothiazide    Latest Ref Rng & Units 06/30/2022   10:49 AM 10/28/2021   10:41 AM 07/01/2020    2:08 PM  BMP  Glucose 65 - 99 mg/dL 80  82  56   BUN 7 - 25 mg/dL 30  19  26    Creatinine 0.70 - 1.35 mg/dL 1.61  0.96  0.45   BUN/Creat Ratio 6 - 22 (calc) 19  SEE NOTE:  19   Sodium 135 - 146 mmol/L 138  138  138   Potassium 3.5 - 5.3 mmol/L 4.5  4.2  4.1   Chloride 98 - 110 mmol/L 104  108  105   CO2 20 - 32 mmol/L 25  26  27    Calcium 8.6 - 10.3 mg/dL 9.9  9.4  9.9      CKD  stage 3 a Lab Results  Component Value Date   CREATININE 1.56 (H) 06/30/2022   CREATININE 1.31 10/28/2021   CREATININE 1.34 (H) 07/01/2020     DM Lab Results  Component Value Date   HGBA1C 6.8 (H) 06/30/2022   HGBA1C 6.1 (H) 10/28/2021   HGBA1C 6.0 (H) 03/10/2021   On trulicity, metformin, glipizide Reports hypoglycemic episodes Does not check his blood sugars  Patient reports that he takes metformin, glipizide only once a day rather than twice a day as prescribed.  Rt shoulder pain  Patient complains of chronic right shoulder pain.  Reports difficulty with overhead activities No recent trauma Takes Tylenol  as needed for pain Did not try physical therapy recently  Toenail fungus-patient reports that his left great toenail is thick and falling off Denies pain, redness around the nailbed    Review of Systems:  Review of Systems  Constitutional:  Negative for chills and fever.  HENT:  Negative for congestion and sore throat.   Eyes:  Negative for double vision.  Respiratory:  Negative for cough, sputum production and shortness of breath.   Cardiovascular:  Negative for chest pain, palpitations and leg swelling.  Gastrointestinal:  Negative for abdominal pain, heartburn and nausea.  Genitourinary:  Negative for dysuria, frequency and hematuria.  Musculoskeletal:  Negative for falls and myalgias.  Neurological:  Negative for dizziness, sensory change and focal weakness.    Past Medical History:  Diagnosis Date   Arthritis    Class 2 obesity    Diabetes mellitus without complication (HCC)    HLD (hyperlipidemia)    Hypertension    Vitamin D deficiency    Past Surgical History:  Procedure Laterality Date   CIRCUMCISION     COLONOSCOPY WITH PROPOFOL N/A 05/27/2017   Procedure: COLONOSCOPY WITH PROPOFOL;  Surgeon: Midge Minium,  MD;  Location: [facility];  Service: Endoscopy;  Laterality: N/A;  Diabetic   POLYPECTOMY  05/27/2017   Procedure: POLYPECTOMY;  Surgeon: Midge Minium, MD;  [facility];  Service: Endoscopy;;   Social History:   reports that he has never smoked. He has never used smokeless tobacco. He reports current alcohol use. He reports that he does not use drugs.  History reviewed. No pertinent family history.  Medications: Patient's Medications  New Prescriptions   No medications on file  Previous Medications   ASPIRIN 81 MG CHEWABLE TABLET    Chew by mouth daily.   CARVEDILOL (COREG) 25 MG TABLET    TAKE 1 TABLET BY MOUTH TWICE DAILY WITH A MEAL   CETIRIZINE (ZYRTEC) 10 MG TABLET    Take 1 tablet by mouth once daily   DULAGLUTIDE (TRULICITY)  3 MG/0.5ML SOPN    INJECT 3MG  SUBCUTANEOUSLY ONCE A WEEK   GABAPENTIN (NEURONTIN) 300 MG CAPSULE    Take 1 capsule by mouth twice daily   GLIPIZIDE (GLUCOTROL) 10 MG TABLET    TAKE 1 TABLET BY MOUTH TWICE DAILY BEFORE A MEAL   HYDROCHLOROTHIAZIDE (HYDRODIURIL) 25 MG TABLET    Take 1 tablet by mouth once daily   LISINOPRIL (ZESTRIL) 40 MG TABLET    Take 1 tablet by mouth once daily   METFORMIN (GLUCOPHAGE-XR) 500 MG 24 HR TABLET    Take 2 tablets by mouth twice daily   MULTIPLE VITAMIN (MULTIVITAMIN) CAPSULE    Take 1 capsule by mouth daily.   SIMVASTATIN (ZOCOR) 20 MG TABLET    Take 1 tablet by mouth once daily   TIZANIDINE (ZANAFLEX) 4 MG TABLET    TAKE 1 TABLET BY MOUTH THREE TIMES DAILY FOR BACK PAIN  Modified Medications   No medications on file  Discontinued Medications   No medications on file    Physical Exam:  Vitals:   12/07/22 1016 12/07/22 1017  BP: (!) 170/84 (!) 144/80  Pulse: (!) 56   Resp: 17   Temp: 97.8 F (36.6 C)   SpO2: 97%   Weight: 241 lb 12.8 oz (109.7 kg)   Height: 6\' 2"  (1.88 m)    Body mass index is 31.05 kg/m. Wt Readings from Last 3 Encounters:  12/07/22 241 lb 12.8 oz (109.7 kg)  06/30/22 248 lb 6 oz (112.7 kg)  10/28/21 259 lb 3.2 oz (117.6 kg)    Physical Exam Constitutional:      Appearance: Normal appearance.  HENT:     Head: Normocephalic and atraumatic.  Cardiovascular:     Rate and Rhythm: Normal rate and regular rhythm.     Pulses: Normal pulses.     Heart sounds: Normal heart sounds.  Pulmonary:     Effort: No respiratory distress.     Breath sounds: No stridor. No wheezing or rales.  Abdominal:     General: Bowel sounds are normal. There is no distension.     Palpations: Abdomen is soft.     Tenderness: There is no abdominal tenderness. There is no right CVA tenderness or guarding.  Musculoskeletal:        General: No swelling.     Comments: Rt shoulder - Abduction limited to 90 degree   Neurological:     Mental Status: He  is alert. Mental status is at baseline.     Sensory: No sensory deficit.     Motor: No weakness.     Labs reviewed: Basic Metabolic Panel: Recent Labs  06/30/22 1049  NA 138  K 4.5  CL 104  CO2 25  GLUCOSE 80  BUN 30*  CREATININE 1.56*  CALCIUM 9.9   Liver Function Tests: No results for input(s): "AST", "ALT", "ALKPHOS", "BILITOT", "PROT", "ALBUMIN" in the last 8760 hours. No results for input(s): "LIPASE", "AMYLASE" in the last 8760 hours. No results for input(s): "AMMONIA" in the last 8760 hours. CBC: No results for input(s): "WBC", "NEUTROABS", "HGB", "HCT", "MCV", "PLT" in the last 8760 hours. Lipid Panel: No results for input(s): "CHOL", "HDL", "LDLCALC", "TRIG", "CHOLHDL", "LDLDIRECT" in the last 8760 hours. TSH: No results for input(s): "TSH" in the last 8760 hours. A1C: Lab Results  Component Value Date   HGBA1C 6.8 (H) 06/30/2022     Assessment/Plan  1. Type 2 diabetes mellitus with other specified complication, without long-term current use of insulin (HCC) Will change glipizide to 5 mg daily due to history of hypoglycemia Patient reported taking metformin thousand daily change on his medication history - glipiZIDE (GLUCOTROL) 5 MG tablet; Take 1 tablet (5 mg total) by mouth daily.  Dispense: 90 tablet; Refill: 3 - metFORMIN (GLUCOPHAGE-XR) 500 MG 24 hr tablet; Take 2 tablets (1,000 mg total) by mouth daily.  Dispense: 180 tablet; Refill: 1 - Lipid Panel  2. Primary hypertension Repeat blood pressure improved but still high Instructed patient to take Coreg twice a day Avoid salty foods,  3. Hyperlipidemia, unspecified hyperlipidemia type Will check lipid panel today Continue with  4. Neuropathy Patient reported that his gabapentin prescription was changed to twice a day and he gets some pain in his feet from neuropathy He is requesting if his prescription can be changed to 3 times daily - gabapentin (NEURONTIN) 300 MG capsule; Take 1 capsule (300  mg total) by mouth 3 (three) times daily.  Dispense: 90 capsule; Refill: 3  5. Stage 3a chronic kidney disease (HCC) Instructed patient to avoid nephrotoxic medications Increase oral hydration Will check CBC, BMP, urine microalbumin - CBC (no diff) - Basic Metabolic Panel with eGFR - Microalbumin/Creatinine Ratio, Urine  6. Vitamin D deficiency  - Vitamin D, 25-hydroxy  7. Onychomycosis  - Ambulatory referral to Podiatry  8. Chronic right shoulder pain  - Ambulatory referral to Physical Therapy  9. Need for influenza vaccination   No follow-ups on file.: 2 months

## 2022-12-08 ENCOUNTER — Other Ambulatory Visit: Payer: Self-pay | Admitting: Sports Medicine

## 2022-12-08 ENCOUNTER — Other Ambulatory Visit: Payer: Self-pay

## 2022-12-08 DIAGNOSIS — D649 Anemia, unspecified: Secondary | ICD-10-CM

## 2022-12-08 MED ORDER — VITAMIN D3 50 MCG (2000 UT) PO CAPS: 2000.0000 [IU] | ORAL_CAPSULE | Freq: Every day | ORAL | 2 refills | Status: AC

## 2022-12-09 LAB — TEST AUTHORIZATION

## 2022-12-09 LAB — CBC
HCT: 31.7 % — ABNORMAL LOW (ref 38.5–50.0)
Hemoglobin: 10.5 g/dL — ABNORMAL LOW (ref 13.2–17.1)
MCH: 30.2 pg (ref 27.0–33.0)
MCHC: 33.1 g/dL (ref 32.0–36.0)
MCV: 91.1 fL (ref 80.0–100.0)
MPV: 10.8 fL (ref 7.5–12.5)
Platelets: 158 10*3/uL (ref 140–400)
RBC: 3.48 10*6/uL — ABNORMAL LOW (ref 4.20–5.80)
RDW: 12.7 % (ref 11.0–15.0)
WBC: 3.9 10*3/uL (ref 3.8–10.8)

## 2022-12-09 LAB — IRON,TIBC AND FERRITIN PANEL
%SAT: 22 % (ref 20–48)
Ferritin: 69 ng/mL (ref 24–380)
Iron: 67 ug/dL (ref 50–180)
TIBC: 303 ug/dL (ref 250–425)

## 2022-12-09 LAB — LIPID PANEL
Cholesterol: 120 mg/dL (ref <200)
HDL: 55 mg/dL (ref >=40)
LDL Cholesterol (Calc): 52 mg/dL
Non-HDL Cholesterol (Calc): 65 mg/dL (ref <130)
Total CHOL/HDL Ratio: 2.2 (calc) (ref <5.0)
Triglycerides: 51 mg/dL (ref <150)

## 2022-12-09 LAB — BASIC METABOLIC PANEL WITH GFR
BUN: 22 mg/dL (ref 7–25)
CO2: 27 mmol/L (ref 20–32)
Calcium: 9.4 mg/dL (ref 8.6–10.3)
Chloride: 104 mmol/L (ref 98–110)
Creat: 1.34 mg/dL (ref 0.70–1.35)
Glucose, Bld: 85 mg/dL (ref 65–99)
Potassium: 4.3 mmol/L (ref 3.5–5.3)
Sodium: 137 mmol/L (ref 135–146)
eGFR: 60 mL/min/{1.73_m2} (ref >=60)

## 2022-12-09 LAB — MICROALBUMIN / CREATININE URINE RATIO
Creatinine, Urine: 136 mg/dL (ref 20–320)
Microalb Creat Ratio: 113 mg/g{creat} — ABNORMAL HIGH (ref <30)
Microalb, Ur: 15.4 mg/dL

## 2022-12-09 LAB — VITAMIN D 25 HYDROXY (VIT D DEFICIENCY, FRACTURES): Vit D, 25-Hydroxy: 19 ng/mL — ABNORMAL LOW (ref 30–100)

## 2022-12-10 ENCOUNTER — Other Ambulatory Visit: Payer: Self-pay | Admitting: Sports Medicine

## 2022-12-10 MED ORDER — FERROUS GLUCONATE 324 (38 FE) MG PO TABS: 324.0000 mg | ORAL_TABLET | Freq: Every day | ORAL | 3 refills | Status: DC

## 2022-12-10 NOTE — Progress Notes (Signed)
Will start iron supplements Will instruct patient to start stool softeners

## 2022-12-10 NOTE — Progress Notes (Signed)
Venita Sheffield, MD  [facility] Clinical Pool1 hour ago (10:00 AM)   Plz call the patient and inform that iron levels are low, he need to start taking iron supplements daily along with stool softener, script sent to his pharmacy.      Patient Notified and agreed.

## 2022-12-22 ENCOUNTER — Encounter: Payer: Self-pay | Admitting: Podiatry

## 2022-12-22 ENCOUNTER — Ambulatory Visit: Payer: No Typology Code available for payment source | Admitting: Podiatry

## 2022-12-22 DIAGNOSIS — B351 Tinea unguium: Secondary | ICD-10-CM

## 2022-12-22 DIAGNOSIS — L603 Nail dystrophy: Secondary | ICD-10-CM | POA: Diagnosis not present

## 2022-12-22 DIAGNOSIS — M79676 Pain in unspecified toe(s): Secondary | ICD-10-CM

## 2022-12-22 NOTE — Progress Notes (Signed)
He presents today chief complaint of a hallux left toenail is thick discolored and has been that way for years states that the nail was loose but not sore he states that he is diabetic.  Objective: Vital signs are stable he is alert and oriented x 3.  Pulses are palpable.  Neurologic sensorium is intact toenails are thick yellow dystrophic onychomycotic distal onycholysis to the hallux left.  No signs of bacterial infection.  Assessment: Nail dystrophy with diabetes and pain in limb secondary to onychomycosis.  Plan: Near complete removal of the hallux nail left just trimming with nail clippers.  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

## 2023-01-05 ENCOUNTER — Other Ambulatory Visit: Payer: Self-pay | Admitting: Family Medicine

## 2023-01-09 ENCOUNTER — Other Ambulatory Visit: Payer: Self-pay | Admitting: Family Medicine

## 2023-02-08 ENCOUNTER — Other Ambulatory Visit: Payer: Self-pay | Admitting: Family Medicine

## 2023-02-08 ENCOUNTER — Ambulatory Visit: Payer: No Typology Code available for payment source | Admitting: Sports Medicine

## 2023-03-15 ENCOUNTER — Other Ambulatory Visit: Payer: Self-pay | Admitting: Family Medicine

## 2023-03-15 DIAGNOSIS — G8929 Other chronic pain: Secondary | ICD-10-CM

## 2023-03-16 ENCOUNTER — Ambulatory Visit: Payer: No Typology Code available for payment source | Admitting: Sports Medicine

## 2023-04-05 ENCOUNTER — Encounter: Payer: No Typology Code available for payment source | Admitting: Sports Medicine

## 2023-04-06 NOTE — Progress Notes (Signed)
 This encounter was created in error - please disregard.

## 2023-04-12 ENCOUNTER — Ambulatory Visit: Payer: No Typology Code available for payment source | Admitting: Sports Medicine

## 2023-04-12 ENCOUNTER — Encounter: Payer: Self-pay | Admitting: Sports Medicine

## 2023-04-12 VITALS — BP 148/88 | HR 60 | Temp 97.6°F | Resp 17 | Ht 74.0 in | Wt 246.4 lb

## 2023-04-12 DIAGNOSIS — Z113 Encounter for screening for infections with a predominantly sexual mode of transmission: Secondary | ICD-10-CM

## 2023-04-12 DIAGNOSIS — E1169 Type 2 diabetes mellitus with other specified complication: Secondary | ICD-10-CM

## 2023-04-12 DIAGNOSIS — N1831 Chronic kidney disease, stage 3a: Secondary | ICD-10-CM

## 2023-04-12 DIAGNOSIS — I10 Essential (primary) hypertension: Secondary | ICD-10-CM | POA: Diagnosis not present

## 2023-04-12 DIAGNOSIS — R2689 Other abnormalities of gait and mobility: Secondary | ICD-10-CM

## 2023-04-12 DIAGNOSIS — Z23 Encounter for immunization: Secondary | ICD-10-CM

## 2023-04-12 DIAGNOSIS — M25511 Pain in right shoulder: Secondary | ICD-10-CM

## 2023-04-12 DIAGNOSIS — Z9189 Other specified personal risk factors, not elsewhere classified: Secondary | ICD-10-CM

## 2023-04-12 DIAGNOSIS — D509 Iron deficiency anemia, unspecified: Secondary | ICD-10-CM

## 2023-04-12 DIAGNOSIS — M25512 Pain in left shoulder: Secondary | ICD-10-CM

## 2023-04-12 DIAGNOSIS — Z1211 Encounter for screening for malignant neoplasm of colon: Secondary | ICD-10-CM

## 2023-04-12 DIAGNOSIS — G8929 Other chronic pain: Secondary | ICD-10-CM

## 2023-04-12 DIAGNOSIS — E785 Hyperlipidemia, unspecified: Secondary | ICD-10-CM

## 2023-04-12 MED ORDER — BLOOD GLUCOSE MONITORING SUPPL DEVI: 1.0000 | Freq: Three times a day (TID) | 0 refills | Status: AC

## 2023-04-12 MED ORDER — LANCETS MISC. MISC: 1.0000 | Freq: Three times a day (TID) | 0 refills | Status: AC

## 2023-04-12 MED ORDER — LANCET DEVICE MISC: 1.0000 | Freq: Three times a day (TID) | 2 refills | Status: AC

## 2023-04-12 MED ORDER — BLOOD GLUCOSE TEST STRIPS 333 VI STRP: 1.0000 | ORAL_STRIP | Freq: Every day | 2 refills | Status: AC

## 2023-04-12 NOTE — Progress Notes (Signed)
 Careteam: Patient Care Team: Nathan Madden, MD as PCP - General (Internal Medicine) [place], [facility] (Ophthalmology)  PLACE OF SERVICE:  [facility]  Advanced Directive information Does Patient Have a Medical Advance Directive?: No, Would patient like information on creating a medical advance directive?: No - Patient declined  No Known Allergies  Chief Complaint  Patient presents with   Medical Management of Chronic Issues    2 month follow up.    Health Maintenance    Discuss the need for Colonoscopy, Eye exam, Hemoglobin A1C, HIV Screening, and Hepatitis C Screening.    Immunizations    Discuss the need for Pne vaccine, and Covid Booster.      Discussed the use of AI scribe software for clinical note transcription with the patient, who gave verbal consent to proceed.  History of Present Illness   The patient, with a history of diabetes, hypertension, and hyperlipidemia, presents with bilateral shoulder pain, which has been exacerbated by cold weather. The pain has now extended to both elbows, accompanied by numbness and tingling. The patient also reports balance issues, particularly upon standing after sitting for prolonged periods, with a sensation of being on the verge of falling. There is no history of actual falls. The patient occasionally experiences dizziness upon standing, but this is not a consistent symptom.  The patient's spouse has observed episodes of the patient crying out during sleep, suggesting possible sleep disturbances. The patient has a history of sleep studies conducted approximately two years ago. Pt denied being depressed or worried.  The patient's diabetes management includes glipizide  and metformin , but he does not regularly monitor his blood glucose levels at home. The patient's hypertension is managed with lisinopril  and hydrochlorothiazide , and his hyperlipidemia is managed with simvastatin . The patient also takes carvedilol  and  Trulicity , but the indications for these medications are not specified in the conversation.  The patient's diet includes fried foods, but he reports not adding extra salt to his meals. He is physically active at work, averaging three to four miles of walking daily. The patient's spouse does the cooking and reports that the food is not overly salty.  The patient has not had an eye exam within the last year and is due for a colonoscopy. He was slightly anemic at his last check-up, not taking iron  tablets.             Review of Systems:  Review of Systems  Constitutional:  Negative for chills and fever.  HENT:  Negative for congestion and sore throat.   Eyes:  Negative for double vision.  Respiratory:  Negative for cough, sputum production and shortness of breath.   Cardiovascular:  Negative for chest pain, palpitations and leg swelling.  Gastrointestinal:  Negative for abdominal pain, heartburn and nausea.  Genitourinary:  Negative for dysuria, frequency and hematuria.  Musculoskeletal:  Positive for myalgias. Negative for falls.  Neurological:  Negative for dizziness, sensory change and focal weakness.   Negative unless indicated in HPI.   Past Medical History:  Diagnosis Date   Arthritis    Class 2 obesity    Diabetes mellitus without complication (HCC)    HLD (hyperlipidemia)    Hypertension    Vitamin D  deficiency    Past Surgical History:  Procedure Laterality Date   CIRCUMCISION     COLONOSCOPY WITH PROPOFOL  N/A 05/27/2017   Procedure: COLONOSCOPY WITH PROPOFOL ;  Surgeon: Jinny Carmine, MD;  Location: [facility];  Service: Endoscopy;  Laterality: N/A;  Diabetic  POLYPECTOMY  05/27/2017   Procedure: POLYPECTOMY;  Surgeon: Jinny Carmine, MD;  Location: [facility];  Service: Endoscopy;;   Social History:   reports that he has never smoked. He has never used smokeless tobacco. He reports current alcohol use. He reports that he does not use drugs.  History  reviewed. No pertinent family history.  Medications: Patient's Medications  New Prescriptions   No medications on file  Previous Medications   ASPIRIN 81 MG CHEWABLE TABLET    Chew by mouth daily.   CARVEDILOL  (COREG ) 25 MG TABLET    TAKE 1 TABLET BY MOUTH TWICE DAILY WITH A MEAL   CETIRIZINE  (ZYRTEC ) 10 MG TABLET    Take 1 tablet by mouth once daily   CHOLECALCIFEROL (VITAMIN D3) 50 MCG (2000 UT) CAPSULE    Take 1 capsule (2,000 Units total) by mouth daily.   DULAGLUTIDE  (TRULICITY ) 3 MG/0.5ML SOPN    INJECT 3MG  SUBCUTANEOUSLY ONCE A WEEK   GABAPENTIN  (NEURONTIN ) 300 MG CAPSULE    Take 1 capsule (300 mg total) by mouth 3 (three) times daily.   GLIPIZIDE  (GLUCOTROL ) 5 MG TABLET    Take 1 tablet (5 mg total) by mouth daily.   HYDROCHLOROTHIAZIDE  (HYDRODIURIL ) 25 MG TABLET    Take 1 tablet by mouth once daily   LISINOPRIL  (ZESTRIL ) 40 MG TABLET    Take 1 tablet by mouth once daily   METFORMIN  (GLUCOPHAGE -XR) 500 MG 24 HR TABLET    Take 2 tablets (1,000 mg total) by mouth daily.   MULTIPLE VITAMIN (MULTIVITAMIN) CAPSULE    Take 1 capsule by mouth daily.   SIMVASTATIN  (ZOCOR ) 20 MG TABLET    Take 1 tablet by mouth once daily   TIZANIDINE  (ZANAFLEX ) 4 MG TABLET    TAKE 1 TABLET BY MOUTH THREE TIMES DAILY FOR BACK PAIN  Modified Medications   No medications on file  Discontinued Medications   FERROUS GLUCONATE  (FERGON) 324 MG TABLET    Take 1 tablet (324 mg total) by mouth daily.    Physical Exam: Vitals:   04/12/23 1028  BP: (!) 142/82  Pulse: 60  Resp: 17  Temp: 97.6 F (36.4 C)  SpO2: 98%  Weight: 246 lb 6.4 oz (111.8 kg)  Height: 6' 2 (1.88 m)   Body mass index is 31.64 kg/m. BP Readings from Last 3 Encounters:  04/12/23 (!) 142/82  12/07/22 (!) 144/80  06/30/22 128/88   Wt Readings from Last 3 Encounters:  04/12/23 246 lb 6.4 oz (111.8 kg)  12/07/22 241 lb 12.8 oz (109.7 kg)  06/30/22 248 lb 6 oz (112.7 kg)    Physical Exam Constitutional:      Appearance: Normal  appearance.  HENT:     Head: Normocephalic and atraumatic.  Cardiovascular:     Rate and Rhythm: Normal rate and regular rhythm.     Pulses: Normal pulses.     Heart sounds: Normal heart sounds.  Pulmonary:     Effort: No respiratory distress.     Breath sounds: No stridor. No wheezing or rales.  Abdominal:     General: Bowel sounds are normal. There is no distension.     Palpations: Abdomen is soft.     Tenderness: There is no abdominal tenderness. There is no right CVA tenderness or guarding.  Musculoskeletal:        General: No swelling.     Comments: Abduction limited in both shoulders Empty can positive Strength intact  Neurological:     Mental Status: He is  alert. Mental status is at baseline.     Sensory: No sensory deficit.     Motor: No weakness.     Labs reviewed: Basic Metabolic Panel: Recent Labs    06/30/22 1049 12/07/22 1051  NA 138 137  K 4.5 4.3  CL 104 104  CO2 25 27  GLUCOSE 80 85  BUN 30* 22  CREATININE 1.56* 1.34  CALCIUM 9.9 9.4   Liver Function Tests: No results for input(s): AST, ALT, ALKPHOS, BILITOT, PROT, ALBUMIN in the last 8760 hours. No results for input(s): LIPASE, AMYLASE in the last 8760 hours. No results for input(s): AMMONIA in the last 8760 hours. CBC: Recent Labs    12/07/22 1051  WBC 3.9  HGB 10.5*  HCT 31.7*  MCV 91.1  PLT 158   Lipid Panel: Recent Labs    12/07/22 1051  CHOL 120  HDL 55  LDLCALC 52  TRIG 51  CHOLHDL 2.2   TSH: No results for input(s): TSH in the last 8760 hours. A1C: Lab Results  Component Value Date   HGBA1C 6.8 (H) 06/30/2022     Assessment/Plan   1. Need for pneumococcal 20-valent conjugate vaccination (Primary)   - Pneumococcal conjugate vaccine 20-valent (Prevnar 20)  2. Chronic pain of both shoulders  Bilateral shoulder pain and hand pain, exacerbated by cold weather. Limited range of motion in shoulders. -Order shoulder X-rays. -Refer to physical  therapy Instructed to take tylenol 650 mg q6 prn  - Ambulatory referral to Physical Therapy - DG Shoulder Right; Future - DG Shoulder Left; Future  3. Type 2 diabetes mellitus with other specified complication, without long-term current use of insulin (HCC)  Provide glucometer and instruct to check blood glucose levels daily. -Repeat A1c tes - Hemoglobin A1C - Ambulatory referral to Ophthalmology - Basic Metabolic Panel with eGFR  4. Primary hypertension  Blood pressure was elevated during visit. -Encourage patient to monitor blood pressure at home. -Continue current medications (lisinopril  40mg , hydrochlorothiazide , carvedilol ). Repeat still the same Avoid salty foods Will follow up in 6 weeks  5. Hyperlipidemia, unspecified hyperlipidemia type  Currently on simvastatin . -Continue current medication.     Stage 3a chronic kidney disease (HCC)  Avoid nephrotoxic meds Will check bmp   Balance problems   - Ambulatory referral to Physical Therapy   Screening for colon cancer   - Ambulatory referral to Gastroenterology   Screening for STD (sexually transmitted disease)   - HIV antibody (with reflex) - Hepatitis C antibody    At risk for sleep apnea   - Ambulatory referral to Sleep Studies  Iron  deficiency anemia, unspecified iron  deficiency anemia type Instructed to take iron  pills - CBC With Differential/Platelet   No follow-ups on file.:

## 2023-04-13 ENCOUNTER — Other Ambulatory Visit: Payer: Self-pay | Admitting: Sports Medicine

## 2023-04-13 LAB — BASIC METABOLIC PANEL WITH GFR
BUN/Creatinine Ratio: 15 (calc) (ref 6–22)
BUN: 24 mg/dL (ref 7–25)
CO2: 28 mmol/L (ref 20–32)
Calcium: 9.1 mg/dL (ref 8.6–10.3)
Chloride: 105 mmol/L (ref 98–110)
Creat: 1.57 mg/dL — ABNORMAL HIGH (ref 0.70–1.35)
Glucose, Bld: 72 mg/dL (ref 65–99)
Potassium: 4.5 mmol/L (ref 3.5–5.3)
Sodium: 140 mmol/L (ref 135–146)
eGFR: 50 mL/min/{1.73_m2} — ABNORMAL LOW (ref >=60)

## 2023-04-13 LAB — HIV ANTIBODY (ROUTINE TESTING W REFLEX): HIV 1&2 Ab, 4th Generation: NONREACTIVE

## 2023-04-13 LAB — HEMOGLOBIN A1C
Hgb A1c MFr Bld: 6.2 %{Hb} — ABNORMAL HIGH (ref <5.7)
Mean Plasma Glucose: 131 mg/dL
eAG (mmol/L): 7.3 mmol/L

## 2023-04-13 LAB — CBC WITH DIFFERENTIAL/PLATELET
Absolute Lymphocytes: 1845 {cells}/uL (ref 850–3900)
Absolute Monocytes: 527 {cells}/uL (ref 200–950)
Basophils Absolute: 12 {cells}/uL (ref 0–200)
Basophils Relative: 0.3 %
Eosinophils Absolute: 62 {cells}/uL (ref 15–500)
Eosinophils Relative: 1.6 %
HCT: 32.9 % — ABNORMAL LOW (ref 38.5–50.0)
Hemoglobin: 10.8 g/dL — ABNORMAL LOW (ref 13.2–17.1)
MCH: 30.6 pg (ref 27.0–33.0)
MCHC: 32.8 g/dL (ref 32.0–36.0)
MCV: 93.2 fL (ref 80.0–100.0)
MPV: 10.9 fL (ref 7.5–12.5)
Monocytes Relative: 13.5 %
Neutro Abs: 1455 {cells}/uL — ABNORMAL LOW (ref 1500–7800)
Neutrophils Relative %: 37.3 %
Platelets: 143 10*3/uL (ref 140–400)
RBC: 3.53 10*6/uL — ABNORMAL LOW (ref 4.20–5.80)
RDW: 12.7 % (ref 11.0–15.0)
Total Lymphocyte: 47.3 %
WBC: 3.9 10*3/uL (ref 3.8–10.8)

## 2023-04-13 LAB — HEPATITIS C ANTIBODY: Hepatitis C Ab: NONREACTIVE

## 2023-04-13 MED ORDER — IRON (FERROUS SULFATE) 325 (65 FE) MG PO TABS: 325.0000 mg | ORAL_TABLET | Freq: Every day | ORAL | 1 refills | Status: DC

## 2023-04-20 ENCOUNTER — Ambulatory Visit: Payer: BC Managed Care – PPO | Admitting: Physical Therapy

## 2023-04-21 ENCOUNTER — Encounter: Payer: Self-pay | Admitting: Physical Therapy

## 2023-04-21 ENCOUNTER — Ambulatory Visit: Payer: No Typology Code available for payment source | Admitting: Physical Therapy

## 2023-04-21 DIAGNOSIS — G8929 Other chronic pain: Secondary | ICD-10-CM | POA: Diagnosis not present

## 2023-04-21 DIAGNOSIS — M25512 Pain in left shoulder: Secondary | ICD-10-CM | POA: Diagnosis not present

## 2023-04-21 DIAGNOSIS — M25511 Pain in right shoulder: Secondary | ICD-10-CM

## 2023-04-21 DIAGNOSIS — M6281 Muscle weakness (generalized): Secondary | ICD-10-CM

## 2023-04-21 NOTE — Therapy (Signed)
OUTPATIENT PHYSICAL THERAPY SHOULDER EVALUATION   Patient Name: Nathan Armstrong MRN: [id number] DOB:07/03/1960, 63 y.o., male Today's Date: 04/21/2023  END OF SESSION:  PT End of Session - 04/21/23 1337     Visit Number 1    Number of Visits 10    Date for PT Re-Evaluation 06/16/23    PT Start Time 1300    PT Stop Time 1345    PT Time Calculation (min) 45 min    Activity Tolerance Patient tolerated treatment well    Behavior During Therapy WFL for tasks assessed/performed             Past Medical History:  Diagnosis Date   Arthritis    Class 2 obesity    Diabetes mellitus without complication (HCC)    HLD (hyperlipidemia)    Hypertension    Vitamin D deficiency    Past Surgical History:  Procedure Laterality Date   CIRCUMCISION     COLONOSCOPY WITH PROPOFOL N/A 05/27/2017   Procedure: COLONOSCOPY WITH PROPOFOL;  Surgeon: Midge Minium, MD;  Location: [facility];  Service: Endoscopy;  Laterality: N/A;  Diabetic   POLYPECTOMY  05/27/2017   Procedure: POLYPECTOMY;  Surgeon: Midge Minium, MD;  Location: [facility];  Service: Endoscopy;;   Patient Active Problem List   Diagnosis Date Noted   Hypertension    Type 2 diabetes mellitus with diabetic neuropathy, unspecified (HCC) 03/10/2021   HLD (hyperlipidemia)    Special screening for malignant neoplasms, colon    Polyp of sigmoid colon    Benign neoplasm of transverse colon     PCP: Venita Sheffield, MD   REFERRING PROVIDER: Venita Sheffield, *   REFERRING DIAG: M25.511,G89.29,M25.512 (ICD-10-CM) - Chronic pain of both shoulders  THERAPY DIAG:  Chronic right shoulder pain  Chronic left shoulder pain  Muscle weakness (generalized)  Rationale for Evaluation and Treatment: Rehabilitation  ONSET DATE: pain for 6 months in his shoulders  SUBJECTIVE:                                                                                                                                                                                       SUBJECTIVE STATEMENT: He relays bilat shoulder pain for last 6 months, it has become worse as it has become colder. He denies injury to his shoulders but did fall on his Rt shoulder 3 years ago. He is unsure about N/T down his arms as he does have neuropathy.  Hand dominance: Left  PERTINENT HISTORY: history of diabetes, hypertension, and hyperlipidemia,  PAIN:  NPRS scale: 8/10 upon arrival with raising his arms, no pain at rest Pain location:Front of the shoulder and in  the joing Pain description: pressure, sitff Aggravating factors: reaching up, reaching out,  Relieving factors: laying down   PRECAUTIONS: None  RED FLAGS: None   WEIGHT BEARING RESTRICTIONS: No  FALLS:  Has patient fallen in last 6 months? No  OCCUPATION: Quality work, Armed forces training and education officer, lifting  PLOF: Independent  PATIENT GOALS: be able to raise arms better and decrease pain  NEXT MD VISIT:   OBJECTIVE:  Note: Objective measures were completed at Evaluation unless otherwise noted.  DIAGNOSTIC FINDINGS:  XR ordered but no results in chart at eval  PATIENT SURVEYS:  Eval: FOTO 65% functonal, goal is 73%  COGNITION: Overall cognitive status: Within functional limits for tasks assessed     SENSATION: WFL   UPPER EXTREMITY ROM:   Active ROM/PROM Right eval Left eval  Shoulder flexion 80/120 70/120  Shoulder extension    Shoulder abduction 90/120 90/120  Shoulder adduction    Shoulder internal rotation /30 /30  Shoulder external rotation /80 /70  Elbow flexion    Elbow extension    Wrist flexion    Wrist extension    Wrist ulnar deviation    Wrist radial deviation    Wrist pronation    Wrist supination    (Blank rows = not tested)  UPPER EXTREMITY MMT:  MMT Right eval Left eval  Shoulder flexion 3 3  Shoulder extension    Shoulder abduction 4 4  Shoulder adduction    Shoulder internal rotation 4 4  Shoulder external rotation 4 4   Middle trapezius    Lower trapezius    Elbow flexion    Elbow extension    Wrist flexion    Wrist extension    Wrist ulnar deviation    Wrist radial deviation    Wrist pronation    Wrist supination    Grip strength (lbs)    (Blank rows = not tested)  SHOULDER SPECIAL TESTS: Rotator cuff assessment: Drop arm test: negative   JOINT MOBILITY TESTING:  GH mobility very stiff and consistent with frozen shoulder                                                                                                                           TODAY'S TREATMENT:  Eval HEP creation and review with demonstration and trial set preformed, see below for details Moist heat X 10 min    PATIENT EDUCATION: Education details: HEP, PT plan of care Person educated: Patient Education method: Explanation, Demonstration, Verbal cues, and Handouts Education comprehension: verbalized understanding and needs further education   HOME EXERCISE PROGRAM: Access Code: [email or web address] URL: [email or web address] Date: 04/21/2023 Prepared by: Ivery Quale  Exercises - Supine Shoulder Flexion Extension AAROM with Dowel  - 2 x daily - 7 x weekly - 1 sets - 10 reps - 5 sec hold - Supine Shoulder Abduction AAROM with Dowel  - 2 x daily - 6 x weekly - 1 sets - 10 reps - 5 sec hold - Supine Shoulder External Rotation  in Scaption AAROM  - 2 x daily - 6 x weekly - 1 sets - 10 reps - 5 sec hold - Bilateral Shoulder Flexion Wall Slide with Towel  - 2 x daily - 6 x weekly - 1 sets - 10 reps - 5 sec hold - Doorway Pec Stretch at 90 Degrees Abduction  - 2 x daily - 3-4 x weekly - 1 sets - 5 reps - 10 sec hold  ASSESSMENT:  CLINICAL IMPRESSION: Patient referred to PT for chronic bilateral shoulder pain. He has significant shoulder stiffness consistent with adhesive capsulitis. Drop arm test was negative for complete RTC tear but due to his weakness PT can not rule RTC damage. IF no improvement in strength and ROM in  next few weeks then will refer back to MD for further diagnostic testing of Rotator cuff and or cervical myelopathy. Patient will benefit from skilled PT to address below impairments, limitations and improve overall function.  OBJECTIVE IMPAIRMENTS: decreased activity tolerance, decreased shoulder mobility, decreased ROM, decreased strength, impaired flexibility, impaired UE use, postural dysfunction, and pain.  ACTIVITY LIMITATIONS: reaching, lifting, carry,  cleaning, driving, and or occupation  PERSONAL FACTORS:  see above PMH also affecting patient's functional outcome.  REHAB POTENTIAL: Good  CLINICAL DECISION MAKING: Stable/uncomplicated  EVALUATION COMPLEXITY: Low    GOALS: Short term PT Goals Target date: 05/19/2023   Pt will be I and compliant with HEP. Baseline:  Goal status: New Pt will improve bilat shoulder flexion AROM >100 deg  Long term PT goals Target date:06/16/2023   Pt will improve bilat shoulder AROM to WFL (>130 deg flexion and scaption) to improve functional reaching Baseline: Goal status: New Pt will improve  Rt shoulder strength to at least 4+/5 MMT to improve functional strength Baseline: Goal status: New Pt will improve FOTO to at least 73% functional to show improved function Baseline: Goal status: New Pt will reduce pain to overall less than 3/10 with usual activity and work activity. Baseline: Goal status: New  PLAN: PT FREQUENCY: 1-2 times per week   PT DURATION: 4-8 weeks  PLANNED INTERVENTIONS (unless contraindicated):  Canalith repositioning, cryotherapy, Electrical stimulation, Iontophoresis with 4 mg/ml dexamethasome, Moist heat, traction, Ultrasound, gait training, Therapeutic exercise, balance training, neuromuscular re-education, patient/family education,  manual techniques, passive ROM, dry needling, taping, vasopnuematic device, vestibular, spinal manipulations, joint manipulations   PLAN FOR NEXT SESSION: review HEP, needs  shoulder ROM and GH mobility focus and deltoid/scapular strength as tolerated.   Ivery Quale, PT, DPT 04/21/23 2:19 PM

## 2023-04-25 ENCOUNTER — Other Ambulatory Visit: Payer: Self-pay | Admitting: Sports Medicine

## 2023-04-25 DIAGNOSIS — G629 Polyneuropathy, unspecified: Secondary | ICD-10-CM

## 2023-05-05 ENCOUNTER — Encounter: Payer: No Typology Code available for payment source | Admitting: Rehabilitative and Restorative Service Providers"

## 2023-05-10 ENCOUNTER — Encounter: Payer: BC Managed Care – PPO | Admitting: Physical Therapy

## 2023-05-17 ENCOUNTER — Encounter: Payer: BC Managed Care – PPO | Admitting: Rehabilitative and Restorative Service Providers"

## 2023-05-17 NOTE — Therapy (Incomplete)
OUTPATIENT PHYSICAL THERAPY TREATMENT   Patient Name: Nathan Armstrong MRN: [id number] DOB:12/10/1960, 63 y.o., male Today's Date: 05/17/2023  END OF SESSION:    Past Medical History:  Diagnosis Date   Arthritis    Class 2 obesity    Diabetes mellitus without complication (HCC)    HLD (hyperlipidemia)    Hypertension    Vitamin D deficiency    Past Surgical History:  Procedure Laterality Date   CIRCUMCISION     COLONOSCOPY WITH PROPOFOL N/A 05/27/2017   Procedure: COLONOSCOPY WITH PROPOFOL;  Surgeon: Midge Minium, MD;  Location: [facility];  Service: Endoscopy;  Laterality: N/A;  Diabetic   POLYPECTOMY  05/27/2017   Procedure: POLYPECTOMY;  Surgeon: Midge Minium, MD;  Location: [facility];  Service: Endoscopy;;   Patient Active Problem List   Diagnosis Date Noted   Hypertension    Type 2 diabetes mellitus with diabetic neuropathy, unspecified (HCC) 03/10/2021   HLD (hyperlipidemia)    Special screening for malignant neoplasms, colon    Polyp of sigmoid colon    Benign neoplasm of transverse colon     PCP: Venita Sheffield, MD   REFERRING PROVIDER: Venita Sheffield, *   REFERRING DIAG: M25.511,G89.29,M25.512 (ICD-10-CM) - Chronic pain of both shoulders  THERAPY DIAG:  No diagnosis found.  Rationale for Evaluation and Treatment: Rehabilitation  ONSET DATE: pain for 6 months in his shoulders  SUBJECTIVE:                                                                                                                                                                                      SUBJECTIVE STATEMENT: He relays bilat shoulder pain for last 6 months, it has become worse as it has become colder. He denies injury to his shoulders but did fall on his Rt shoulder 3 years ago. He is unsure about N/T down his arms as he does have neuropathy.  Hand dominance: Left  PERTINENT HISTORY: history of diabetes, hypertension, and  hyperlipidemia,  PAIN:  NPRS scale: 8/10 upon arrival with raising his arms, no pain at rest Pain location:Front of the shoulder and in the joing Pain description: pressure, sitff Aggravating factors: reaching up, reaching out,  Relieving factors: laying down   PRECAUTIONS: None  RED FLAGS: None   WEIGHT BEARING RESTRICTIONS: No  FALLS:  Has patient fallen in last 6 months? No  OCCUPATION: Quality work, Armed forces training and education officer, lifting  PLOF: Independent  PATIENT GOALS: be able to raise arms better and decrease pain  NEXT MD VISIT:   OBJECTIVE:  Note: Objective measures were completed at Evaluation unless otherwise noted.  DIAGNOSTIC FINDINGS:  XR ordered but no results in chart  at eval  PATIENT SURVEYS:  Eval: FOTO 65% functonal, goal is 73%  COGNITION: Overall cognitive status: Within functional limits for tasks assessed     SENSATION: WFL   UPPER EXTREMITY ROM:   Active ROM/PROM Right eval Left eval  Shoulder flexion 80/120 70/120  Shoulder extension    Shoulder abduction 90/120 90/120  Shoulder adduction    Shoulder internal rotation /30 /30  Shoulder external rotation /80 /70  Elbow flexion    Elbow extension    Wrist flexion    Wrist extension    Wrist ulnar deviation    Wrist radial deviation    Wrist pronation    Wrist supination    (Blank rows = not tested)  UPPER EXTREMITY MMT:  MMT Right eval Left eval  Shoulder flexion 3 3  Shoulder extension    Shoulder abduction 4 4  Shoulder adduction    Shoulder internal rotation 4 4  Shoulder external rotation 4 4  Middle trapezius    Lower trapezius    Elbow flexion    Elbow extension    Wrist flexion    Wrist extension    Wrist ulnar deviation    Wrist radial deviation    Wrist pronation    Wrist supination    Grip strength (lbs)    (Blank rows = not tested)  SHOULDER SPECIAL TESTS: Rotator cuff assessment: Drop arm test: negative   JOINT MOBILITY TESTING:  GH mobility very stiff  and consistent with frozen shoulder                                                                                                                                          TODAY'S TREATMENT:                                                   DATE: 05/17/2023 Therex:     TODAY'S TREATMENT:                                                   DATE: 04/21/2023 HEP creation and review with demonstration and trial set preformed, see below for details Moist heat X 10 min    PATIENT EDUCATION: Education details: HEP, PT plan of care Person educated: Patient Education method: Explanation, Demonstration, Verbal cues, and Handouts Education comprehension: verbalized understanding and needs further education   HOME EXERCISE PROGRAM: Access Code: [email or web address] URL: [email or web address] Date: 04/21/2023 Prepared by: Ivery Quale  Exercises - Supine Shoulder Flexion Extension AAROM with Dowel  - 2 x daily - 7 x weekly - 1 sets - 10 reps - 5 sec  hold - Supine Shoulder Abduction AAROM with Dowel  - 2 x daily - 6 x weekly - 1 sets - 10 reps - 5 sec hold - Supine Shoulder External Rotation in Scaption AAROM  - 2 x daily - 6 x weekly - 1 sets - 10 reps - 5 sec hold - Bilateral Shoulder Flexion Wall Slide with Towel  - 2 x daily - 6 x weekly - 1 sets - 10 reps - 5 sec hold - Doorway Pec Stretch at 90 Degrees Abduction  - 2 x daily - 3-4 x weekly - 1 sets - 5 reps - 10 sec hold  ASSESSMENT:  CLINICAL IMPRESSION: Patient referred to PT for chronic bilateral shoulder pain. He has significant shoulder stiffness consistent with adhesive capsulitis. Drop arm test was negative for complete RTC tear but due to his weakness PT can not rule RTC damage. IF no improvement in strength and ROM in next few weeks then will refer back to MD for further diagnostic testing of Rotator cuff and or cervical myelopathy. Patient will benefit from skilled PT to address below impairments, limitations and improve overall  function.  OBJECTIVE IMPAIRMENTS: decreased activity tolerance, decreased shoulder mobility, decreased ROM, decreased strength, impaired flexibility, impaired UE use, postural dysfunction, and pain.  ACTIVITY LIMITATIONS: reaching, lifting, carry,  cleaning, driving, and or occupation  PERSONAL FACTORS:  see above PMH also affecting patient's functional outcome.  REHAB POTENTIAL: Good  CLINICAL DECISION MAKING: Stable/uncomplicated  EVALUATION COMPLEXITY: Low    GOALS: Short term PT Goals Target date: 05/19/2023   Pt will be I and compliant with HEP. Baseline:  Goal status: New Pt will improve bilat shoulder flexion AROM >100 deg  Long term PT goals Target date:06/16/2023   Pt will improve bilat shoulder AROM to WFL (>130 deg flexion and scaption) to improve functional reaching Baseline: Goal status: New Pt will improve  Rt shoulder strength to at least 4+/5 MMT to improve functional strength Baseline: Goal status: New Pt will improve FOTO to at least 73% functional to show improved function Baseline: Goal status: New Pt will reduce pain to overall less than 3/10 with usual activity and work activity. Baseline: Goal status: New  PLAN: PT FREQUENCY: 1-2 times per week   PT DURATION: 4-8 weeks  PLANNED INTERVENTIONS (unless contraindicated):  Canalith repositioning, cryotherapy, Electrical stimulation, Iontophoresis with 4 mg/ml dexamethasome, Moist heat, traction, Ultrasound, gait training, Therapeutic exercise, balance training, neuromuscular re-education, patient/family education,  manual techniques, passive ROM, dry needling, taping, vasopnuematic device, vestibular, spinal manipulations, joint manipulations   PLAN FOR NEXT SESSION: review HEP, needs shoulder ROM and GH mobility focus and deltoid/scapular strength as tolerated.    Chyrel Masson, PT, DPT, OCS, ATC 05/17/23  7:53 AM

## 2023-05-18 ENCOUNTER — Other Ambulatory Visit: Payer: Self-pay | Admitting: Sports Medicine

## 2023-06-14 ENCOUNTER — Ambulatory Visit: Payer: No Typology Code available for payment source | Admitting: Sports Medicine

## 2023-06-14 ENCOUNTER — Encounter: Payer: Self-pay | Admitting: Sports Medicine

## 2023-06-14 VITALS — BP 134/84 | HR 60 | Temp 97.4°F | Resp 17 | Ht 74.0 in | Wt 252.8 lb

## 2023-06-14 DIAGNOSIS — E782 Mixed hyperlipidemia: Secondary | ICD-10-CM

## 2023-06-14 DIAGNOSIS — N1831 Chronic kidney disease, stage 3a: Secondary | ICD-10-CM

## 2023-06-14 DIAGNOSIS — Z1211 Encounter for screening for malignant neoplasm of colon: Secondary | ICD-10-CM

## 2023-06-14 DIAGNOSIS — M19012 Primary osteoarthritis, left shoulder: Secondary | ICD-10-CM

## 2023-06-14 DIAGNOSIS — D509 Iron deficiency anemia, unspecified: Secondary | ICD-10-CM

## 2023-06-14 DIAGNOSIS — E1140 Type 2 diabetes mellitus with diabetic neuropathy, unspecified: Secondary | ICD-10-CM | POA: Diagnosis not present

## 2023-06-14 DIAGNOSIS — I10 Essential (primary) hypertension: Secondary | ICD-10-CM | POA: Diagnosis not present

## 2023-06-14 NOTE — Progress Notes (Signed)
 Careteam: Patient Care Team: Venita Sheffield, MD as PCP - General (Internal Medicine) [facility], [facility] (Ophthalmology)  PLACE OF SERVICE:  [facility]  Advanced Directive information Does Patient Have a Medical Advance Directive?: No, Would patient like information on creating a medical advance directive?: No - Patient declined  No Known Allergies  Chief Complaint  Patient presents with   Medical Management of Chronic Issues    2 month follow up.    Immunizations    Discuss the need for [employer].    Health Maintenance    Discuss the need for Colonoscopy, and Eye exam.     Discussed the use of AI scribe software for clinical note transcription with the patient, who gave verbal consent to proceed.  History of Present Illness   Nathan Armstrong is a 63 year old male who presents for follow-up    He was supposed to be scheduled for a colonoscopy but has not been contacted yet.    His diabetes management includes metformin 1000 mg daily , glipizide and Trulicity once a week. He has not been checking his blood sugar regularly and experiences hypoglycemic episodes once or twice a month, characterized by sweating and feeling shaky. His last A1c was 6.2.  He is currently taking lisinopril 40 mg, hydrochlorothiazide, and carvedilol for hypertension. No dizziness or lightheadedness. He walks about three to four miles a day at work, two days on and two days off, without experiencing chest pain or breathing problems.  He experiences tingling and numbness in his fingers and takes gabapentin three times a day. No dizziness or drowsiness from the medication.  He reports joint pain in his left shoulder that comes and goes, worsening in cold weather. He takes tizanidine as needed for this pain.  He is slightly anemic and takes iron pills without experiencing constipation. No blood in urine or stool.  He has seasonal allergies that worsen in the spring, for which he  takes Zyrtec. No breathing problems, dizziness, heartburn, nausea, or urinary issues.         Review of Systems:  Review of Systems  Constitutional:  Negative for chills and fever.  HENT:  Negative for congestion and sore throat.   Eyes:  Negative for double vision.  Respiratory:  Negative for cough, sputum production and shortness of breath.   Cardiovascular:  Negative for chest pain, palpitations and leg swelling.  Gastrointestinal:  Negative for abdominal pain, heartburn and nausea.  Genitourinary:  Negative for dysuria, frequency and hematuria.  Musculoskeletal:  Positive for joint pain. Negative for falls and myalgias.  Neurological:  Positive for sensory change. Negative for dizziness and focal weakness.   Negative unless indicated in HPI.   Past Medical History:  Diagnosis Date   Arthritis    Class 2 obesity    Diabetes mellitus without complication (HCC)    HLD (hyperlipidemia)    Hypertension    Vitamin D deficiency    Past Surgical History:  Procedure Laterality Date   CIRCUMCISION     COLONOSCOPY WITH PROPOFOL N/A 05/27/2017   Procedure: COLONOSCOPY WITH PROPOFOL;  Surgeon: Midge Minium, MD;  Location: [facility];  Service: Endoscopy;  Laterality: N/A;  Diabetic   POLYPECTOMY  05/27/2017   Procedure: POLYPECTOMY;  Surgeon: Midge Minium, MD;  Location: [facility];  Service: Endoscopy;;   Social History:   reports that he has never smoked. He has never used smokeless tobacco. He reports current alcohol use. He reports that he does  not use drugs.  History reviewed. No pertinent family history.  Medications: Patient's Medications  New Prescriptions   No medications on file  Previous Medications   ASPIRIN 81 MG CHEWABLE TABLET    Chew by mouth daily.   BLOOD GLUCOSE MONITORING SUPPL DEVI    1 each by Does not apply route in the morning, at noon, and at bedtime. May substitute to any manufacturer covered by patient's insurance.   CARVEDILOL (COREG)  25 MG TABLET    TAKE 1 TABLET BY MOUTH TWICE DAILY WITH A MEAL   CETIRIZINE (ZYRTEC) 10 MG TABLET    Take 1 tablet by mouth once daily   CHOLECALCIFEROL (VITAMIN D3) 50 MCG (2000 UT) CAPSULE    Take 1 capsule (2,000 Units total) by mouth daily.   DULAGLUTIDE (TRULICITY) 3 MG/0.5ML SOPN    INJECT 3MG  SUBCUTANEOUSLY ONCE A WEEK   GABAPENTIN (NEURONTIN) 300 MG CAPSULE    TAKE 1 CAPSULE BY MOUTH THREE TIMES DAILY   GLUCOSE BLOOD (BLOOD GLUCOSE TEST STRIPS 333) STRP    1 Application by Does not apply route daily.   HYDROCHLOROTHIAZIDE (HYDRODIURIL) 25 MG TABLET    Take 1 tablet by mouth once daily   IRON, FERROUS SULFATE, 325 (65 FE) MG TABS    Take 325 mg by mouth daily.   LISINOPRIL (ZESTRIL) 40 MG TABLET    Take 1 tablet by mouth once daily   METFORMIN (GLUCOPHAGE-XR) 500 MG 24 HR TABLET    Take 2 tablets (1,000 mg total) by mouth daily.   MULTIPLE VITAMIN (MULTIVITAMIN) CAPSULE    Take 1 capsule by mouth daily.   SIMVASTATIN (ZOCOR) 20 MG TABLET    Take 1 tablet by mouth once daily   TIZANIDINE (ZANAFLEX) 4 MG TABLET    TAKE 1 TABLET BY MOUTH THREE TIMES DAILY FOR BACK PAIN  Modified Medications   No medications on file  Discontinued Medications   GLIPIZIDE (GLUCOTROL) 5 MG TABLET    Take 1 tablet (5 mg total) by mouth daily.    Physical Exam: Vitals:   06/14/23 1029  BP: 134/84  Pulse: 60  Resp: 17  Temp: (!) 97.4 F (36.3 C)  SpO2: 98%  Weight: 252 lb 12.8 oz (114.7 kg)  Height: 6\' 2"  (1.88 m)   Body mass index is 32.46 kg/m. BP Readings from Last 3 Encounters:  06/14/23 134/84  04/12/23 (!) 148/88  12/07/22 (!) 144/80   Wt Readings from Last 3 Encounters:  06/14/23 252 lb 12.8 oz (114.7 kg)  04/12/23 246 lb 6.4 oz (111.8 kg)  12/07/22 241 lb 12.8 oz (109.7 kg)    Physical Exam Constitutional:      Appearance: Normal appearance.  HENT:     Head: Normocephalic and atraumatic.  Cardiovascular:     Rate and Rhythm: Normal rate and regular rhythm.     Pulses: Normal  pulses.     Heart sounds: Normal heart sounds.  Pulmonary:     Effort: No respiratory distress.     Breath sounds: No stridor. No wheezing or rales.  Abdominal:     General: Bowel sounds are normal. There is no distension.     Palpations: Abdomen is soft.     Tenderness: There is no abdominal tenderness. There is no right CVA tenderness or guarding.  Musculoskeletal:        General: No swelling.     Comments: Left shoulder- no point tenderness ROM limited , abduction limited to 60 degree Strength intact  Empty can  neg  Neurological:     Mental Status: He is alert. Mental status is at baseline.     Sensory: No sensory deficit.     Motor: No weakness.     Labs reviewed: Basic Metabolic Panel: Recent Labs    06/30/22 1049 12/07/22 1051 04/12/23 1101  NA 138 137 140  K 4.5 4.3 4.5  CL 104 104 105  CO2 25 27 28   GLUCOSE 80 85 72  BUN 30* 22 24  CREATININE 1.56* 1.34 1.57*  CALCIUM 9.9 9.4 9.1   Liver Function Tests: No results for input(s): "AST", "ALT", "ALKPHOS", "BILITOT", "PROT", "ALBUMIN" in the last 8760 hours. No results for input(s): "LIPASE", "AMYLASE" in the last 8760 hours. No results for input(s): "AMMONIA" in the last 8760 hours. CBC: Recent Labs    12/07/22 1051 04/12/23 1101  WBC 3.9 3.9  NEUTROABS  --  1,455*  HGB 10.5* 10.8*  HCT 31.7* 32.9*  MCV 91.1 93.2  PLT 158 143   Lipid Panel: Recent Labs    12/07/22 1051  CHOL 120  HDL 55  LDLCALC 52  TRIG 51  CHOLHDL 2.2   TSH: No results for input(s): "TSH" in the last 8760 hours. A1C: Lab Results  Component Value Date   HGBA1C 6.2 (H) 04/12/2023    Assessment and Plan    Diabetes Mellitus Type 2 Diabetes well-controlled with A1c of 6.2. Episodes of hypoglycemia likely due to glipizide. - Discontinue glipizide due to risk of hypoglycemia. - Continue metformin 1000 mg daily. - Continue Trulicity once a week.  Peripheral Neuropathy Tingling and numbness in fingers. Advised on  potential side effects of gabapentin. - Continue gabapentin three times a day. - Advise to be cautious of dizziness and drowsiness; avoid driving if affected.  Hypertension Blood pressure well-controlled at 134/84 mmHg. - Continue current antihypertensive medications: lisinopril, hydrochlorothiazide, and carvedilol.  Hyperlipidemia LDL cholesterol well-controlled at 52 mg/dL. - Continue simvastatin at bedtime.  Chronic Kidney Disease Stable renal function. Advised to avoid NSAIDs. - Encourage hydration. - Avoid NSAIDs; use Tylenol for pain management.  Chronic Anemia Slight anemia with low blood counts. No constipation reported. - Continue iron supplementation.  Shoulder Pain Intermittent left shoulder pain, exacerbated by cold weather. - Continue tizanidine as needed for shoulder pain.  Colonoscopy Referral Referral for colonoscopy not followed up. Last colonoscopy four years ago with no polyps. - Place a new referral to gastroenterology for colonoscopy in [place].  Eye Exam Has not had an eye exam in about a year. Needs new prescription for glasses. - Instruct him to call and schedule an appointment with the eye doctor.          Return in about 3 months (around 09/14/2023).:

## 2023-06-26 ENCOUNTER — Other Ambulatory Visit: Payer: Self-pay | Admitting: Sports Medicine

## 2023-06-26 DIAGNOSIS — G8929 Other chronic pain: Secondary | ICD-10-CM

## 2023-06-29 ENCOUNTER — Encounter: Payer: Self-pay | Admitting: *Deleted

## 2023-07-17 ENCOUNTER — Other Ambulatory Visit: Payer: Self-pay | Admitting: Sports Medicine

## 2023-08-01 LAB — HM DIABETES EYE EXAM

## 2023-08-10 ENCOUNTER — Other Ambulatory Visit: Payer: Self-pay | Admitting: Sports Medicine

## 2023-08-16 ENCOUNTER — Telehealth: Payer: Self-pay | Admitting: *Deleted

## 2023-08-16 ENCOUNTER — Other Ambulatory Visit: Payer: Self-pay | Admitting: *Deleted

## 2023-08-16 ENCOUNTER — Telehealth: Payer: Self-pay | Admitting: Gastroenterology

## 2023-08-16 DIAGNOSIS — Z860100 Personal history of colon polyps, unspecified: Secondary | ICD-10-CM

## 2023-08-16 MED ORDER — NA SULFATE-K SULFATE-MG SULF 17.5-3.13-1.6 GM/177ML PO SOLN: 1.0000 | Freq: Once | ORAL | 0 refills | Status: AC

## 2023-08-16 NOTE — Telephone Encounter (Signed)
 Colonoscopy schedule on 09/22/2023 with Dr Servando Snare

## 2023-08-16 NOTE — Telephone Encounter (Signed)
 The patient call back because the call have dropped.

## 2023-08-16 NOTE — Telephone Encounter (Signed)
 Gastroenterology Pre-Procedure Review  Request Date: 09/22/2023 Requesting Physician: Dr. Ole Berkeley  PATIENT REVIEW QUESTIONS: The patient responded to the following health history questions as indicated:    1. Are you having any GI issues? no 2. Do you have a personal history of Polyps? yes (last colonoscopy was 05/27/2017) 3. Do you have a family history of Colon Cancer or Polyps? no 4. Diabetes Mellitus? yes (taking Trulicity  and metformin ) 5. Joint replacements in the past 12 months?no 6. Major health problems in the past 3 months?no 7. Any artificial heart valves, MVP, or defibrillator?no    MEDICATIONS & ALLERGIES:    Patient reports the following regarding taking any anticoagulation/antiplatelet therapy:   Plavix, Coumadin, Eliquis, Xarelto, Lovenox, Pradaxa, Brilinta, or Effient? no Aspirin? yes (81 mg)  Patient confirms/reports the following medications:  Current Outpatient Medications  Medication Sig Dispense Refill   aspirin 81 MG chewable tablet Chew by mouth daily.     Blood Glucose Monitoring Suppl DEVI 1 each by Does not apply route in the morning, at noon, and at bedtime. May substitute to any manufacturer covered by patient's insurance. 1 each 0   carvedilol  (COREG ) 25 MG tablet TAKE 1 TABLET BY MOUTH TWICE DAILY WITH A MEAL 60 tablet 11   cetirizine  (ZYRTEC ) 10 MG tablet Take 1 tablet by mouth once daily 90 tablet 1   Cholecalciferol (VITAMIN D3) 50 MCG (2000 UT) capsule Take 1 capsule (2,000 Units total) by mouth daily. 90 capsule 2   Dulaglutide  (TRULICITY ) 3 MG/0.5ML SOPN INJECT 3MG  SUBCUTANEOUSLY ONCE A WEEK 12 mL 1   gabapentin  (NEURONTIN ) 300 MG capsule TAKE 1 CAPSULE BY MOUTH THREE TIMES DAILY 90 capsule 3   Glucose Blood (BLOOD GLUCOSE TEST STRIPS 333) STRP 1 Application by Does not apply route daily. 200 strip 2   hydrochlorothiazide  (HYDRODIURIL ) 25 MG tablet Take 1 tablet by mouth once daily 90 tablet 1   Iron , Ferrous Sulfate , 325 (65 Fe) MG TABS Take 325 mg by  mouth daily. 90 tablet 1   lisinopril  (ZESTRIL ) 40 MG tablet Take 1 tablet by mouth once daily 90 tablet 1   metFORMIN  (GLUCOPHAGE -XR) 500 MG 24 hr tablet Take 2 tablets (1,000 mg total) by mouth daily. 180 tablet 1   Multiple Vitamin (MULTIVITAMIN) capsule Take 1 capsule by mouth daily.     simvastatin  (ZOCOR ) 20 MG tablet Take 1 tablet by mouth once daily 90 tablet 0   tiZANidine  (ZANAFLEX ) 4 MG tablet TAKE 1 TABLET BY MOUTH THREE TIMES DAILY FOR BACK PAIN 270 tablet 0   No current facility-administered medications for this visit.    Patient confirms/reports the following allergies:  No Known Allergies  No orders of the defined types were placed in this encounter.   AUTHORIZATION INFORMATION Primary Insurance: 1D#: Group #:  Secondary Insurance: 1D#: Group #:  SCHEDULE INFORMATION: Date: 09/22/2023 Time: Location:  [facility]

## 2023-08-16 NOTE — Telephone Encounter (Signed)
 The patient called in to schedule his colonoscopy.

## 2023-08-16 NOTE — Telephone Encounter (Signed)
 Patient was transferred but lost contact. I did call patient back but got hung up on.

## 2023-09-13 ENCOUNTER — Other Ambulatory Visit: Payer: Self-pay | Admitting: Sports Medicine

## 2023-09-16 ENCOUNTER — Telehealth: Payer: Self-pay

## 2023-09-16 NOTE — Telephone Encounter (Signed)
 Called endo unit to cancel procedure.

## 2023-09-16 NOTE — Telephone Encounter (Signed)
 The patient wants to cancel his procedure because of the price and he is not sure why.

## 2023-09-16 NOTE — Telephone Encounter (Signed)
 Spoken to patient and he stated that he wants to cancel. Explain to about history of polyps.  Patient verbalized understanding.

## 2023-09-20 ENCOUNTER — Ambulatory Visit: Admitting: Sports Medicine

## 2023-09-22 ENCOUNTER — Ambulatory Visit: Admission: RE | Admit: 2023-09-22 | Source: Home / Self Care | Admitting: Gastroenterology

## 2023-09-22 SURGERY — COLONOSCOPY
Anesthesia: General

## 2023-10-15 ENCOUNTER — Other Ambulatory Visit: Payer: Self-pay | Admitting: Family Medicine

## 2023-10-15 ENCOUNTER — Other Ambulatory Visit: Payer: Self-pay | Admitting: Sports Medicine

## 2023-10-15 DIAGNOSIS — M545 Low back pain, unspecified: Secondary | ICD-10-CM

## 2023-10-22 ENCOUNTER — Other Ambulatory Visit: Payer: Self-pay | Admitting: Sports Medicine

## 2023-10-22 DIAGNOSIS — G629 Polyneuropathy, unspecified: Secondary | ICD-10-CM

## 2023-10-25 ENCOUNTER — Encounter: Payer: Self-pay | Admitting: Sports Medicine

## 2023-10-25 ENCOUNTER — Ambulatory Visit (INDEPENDENT_AMBULATORY_CARE_PROVIDER_SITE_OTHER): Admitting: Sports Medicine

## 2023-10-25 VITALS — BP 150/88 | HR 55 | Temp 98.6°F | Resp 9 | Ht 74.0 in | Wt 243.4 lb

## 2023-10-25 DIAGNOSIS — E782 Mixed hyperlipidemia: Secondary | ICD-10-CM | POA: Diagnosis not present

## 2023-10-25 DIAGNOSIS — I10 Essential (primary) hypertension: Secondary | ICD-10-CM | POA: Diagnosis not present

## 2023-10-25 DIAGNOSIS — E1140 Type 2 diabetes mellitus with diabetic neuropathy, unspecified: Secondary | ICD-10-CM | POA: Diagnosis not present

## 2023-10-25 DIAGNOSIS — N1831 Chronic kidney disease, stage 3a: Secondary | ICD-10-CM

## 2023-10-25 DIAGNOSIS — D509 Iron deficiency anemia, unspecified: Secondary | ICD-10-CM

## 2023-10-25 NOTE — Progress Notes (Signed)
 Careteam: Patient Care Team: [name], MD as PCP - General (Internal Medicine) Pa, [facility] (Ophthalmology)  PLACE OF SERVICE:  [facility]  Advanced Directive information    No Known Allergies  Chief Complaint  Patient presents with   Medical Management of Chronic Issues    3 month follow up / no concerns      Discussed the use of AI scribe software for clinical note transcription with the patient, who gave verbal consent to proceed.  History of Present Illness  Nathan Armstrong is a 63 year old male with hypertension, diabetes, and chronic kidney disease who presents for routine follow-up.  His blood pressure is 142/88 mmHg, and he does not monitor it at home. He consumed a salty chicken pot pie last night, which he thinks might have affected his blood pressure. He is taking carvedilol  25 mg twice a day, hydrochlorothiazide  25 mg, and lisinopril  40 mg daily, adhering to this regimen consistently.  Regarding his diabetes, he has not checked his blood sugars recently. His last A1c in January was 6.2. He consumes sweets occasionally. He is on Trulicity  and metformin , and he was taken off glipizide  due to episodes of hypoglycemia. Since stopping glipizide , he has not experienced symptoms of hypoglycemia such as lightheadedness, shaking, or sweating.  He has a history of chronic kidney disease,  Lab Results  Component Value Date   CREATININE 1.57 (H) 04/12/2023   CREATININE 1.34 12/07/2022   CREATININE 1.56 (H) 06/30/2022     He experiences joint pains when the weather changes, but reports no joint pain in the past few weeks due to hot weather. He occasionally has a runny nose. No sore throat, sinus pain, shortness of breath, chest pain, dizziness, palpitations, heartburn, acid reflux, blood in urine or stool, or urinary issues.  He walks about twelve hours a night for work, which causes foot discomfort despite trying different shoe inserts. He had  an eye exam in May and has not had any falls recently. He has not had a recent colonoscopy due to high out-of-pocket costs, although he had one previously with no financial burden. His insurance has changed from [employer] to [employer] [place] since his last colonoscopy.  He takes cholesterol medication at bedtime and has been taking iron  pills due to previously low blood counts. He reports no need for medication refills at this time.    Review of Systems:  Review of Systems  Constitutional:  Negative for chills and fever.  HENT:  Negative for congestion and sore throat.   Eyes:  Negative for double vision.  Respiratory:  Negative for cough, sputum production and shortness of breath.   Cardiovascular:  Negative for chest pain, palpitations and leg swelling.  Gastrointestinal:  Negative for abdominal pain, heartburn and nausea.  Genitourinary:  Negative for dysuria, frequency and hematuria.  Musculoskeletal:  Negative for falls and myalgias.  Neurological:  Negative for dizziness, sensory change and focal weakness.   Negative unless indicated in HPI.   Past Medical History:  Diagnosis Date   Arthritis    Class 2 obesity    Diabetes mellitus without complication (HCC)    HLD (hyperlipidemia)    Hypertension    Vitamin D  deficiency    Past Surgical History:  Procedure Laterality Date   CIRCUMCISION     COLONOSCOPY WITH PROPOFOL  N/A 05/27/2017   Procedure: COLONOSCOPY WITH PROPOFOL ;  Surgeon: Jinny Carmine, MD;  Location: [facility];  Service: Endoscopy;  Laterality: N/A;  Diabetic   POLYPECTOMY  05/27/2017   Procedure: POLYPECTOMY;  Surgeon: Jinny Carmine, MD;  Location: [facility];  Service: Endoscopy;;   Social History:   reports that he has never smoked. He has never used smokeless tobacco. He reports current alcohol use. He reports that he does not use drugs.  History reviewed. No pertinent family history.  Medications: Patient's Medications  New  Prescriptions   No medications on file  Previous Medications   ASPIRIN 81 MG CHEWABLE TABLET    Chew by mouth daily.   BLOOD GLUCOSE MONITORING SUPPL DEVI    1 each by Does not apply route in the morning, at noon, and at bedtime. May substitute to any manufacturer covered by patient's insurance.   CARVEDILOL  (COREG ) 25 MG TABLET    TAKE 1 TABLET BY MOUTH TWICE DAILY WITH A MEAL   CETIRIZINE  (ZYRTEC ) 10 MG TABLET    Take 1 tablet by mouth once daily   CHOLECALCIFEROL (VITAMIN D3) 50 MCG (2000 UT) CAPSULE    Take 1 capsule (2,000 Units total) by mouth daily.   DULAGLUTIDE  (TRULICITY ) 3 MG/0.5ML SOAJ    INJECT 3 MG SUBCUTANEOUSLY ONCE A WEEK   GABAPENTIN  (NEURONTIN ) 300 MG CAPSULE    TAKE 1 CAPSULE BY MOUTH THREE TIMES DAILY   GLUCOSE BLOOD (BLOOD GLUCOSE TEST STRIPS 333) STRP    1 Application by Does not apply route daily.   HYDROCHLOROTHIAZIDE  (HYDRODIURIL ) 25 MG TABLET    Take 1 tablet by mouth once daily   IRON , FERROUS SULFATE , 325 (65 FE) MG TABS    Take 325 mg by mouth daily.   LISINOPRIL  (ZESTRIL ) 40 MG TABLET    Take 1 tablet by mouth once daily   METFORMIN  (GLUCOPHAGE -XR) 500 MG 24 HR TABLET    Take 2 tablets (1,000 mg total) by mouth daily.   MULTIPLE VITAMIN (MULTIVITAMIN) CAPSULE    Take 1 capsule by mouth daily.   SIMVASTATIN  (ZOCOR ) 20 MG TABLET    Take 1 tablet by mouth once daily   TIZANIDINE  (ZANAFLEX ) 4 MG TABLET    TAKE 1 TABLET BY MOUTH THREE TIMES DAILY FOR BACK PAIN  Modified Medications   No medications on file  Discontinued Medications   No medications on file    Physical Exam: Vitals:   10/25/23 0858  BP: 138/88  Pulse: (!) 55  Resp: (!) 9  Temp: 98.6 F (37 C)  SpO2: 95%  Weight: 243 lb 6.4 oz (110.4 kg)  Height: 6' 2 (1.88 m)   Body mass index is 31.25 kg/m. BP Readings from Last 3 Encounters:  10/25/23 138/88  06/14/23 134/84  04/12/23 (!) 148/88   Wt Readings from Last 3 Encounters:  10/25/23 243 lb 6.4 oz (110.4 kg)  06/14/23 252 lb 12.8 oz  (114.7 kg)  04/12/23 246 lb 6.4 oz (111.8 kg)    Physical Exam Constitutional:      Appearance: Normal appearance.  HENT:     Head: Normocephalic and atraumatic.  Cardiovascular:     Rate and Rhythm: Normal rate and regular rhythm.     Pulses: Normal pulses.     Heart sounds: Normal heart sounds.  Pulmonary:     Effort: No respiratory distress.     Breath sounds: No stridor. No wheezing or rales.  Abdominal:     General: Bowel sounds are normal. There is no distension.     Palpations: Abdomen is soft.     Tenderness: There is no abdominal tenderness. There is no guarding.  Musculoskeletal:        General: No swelling.  Neurological:     Mental Status: He is alert. Mental status is at baseline.     Motor: No weakness.     Labs reviewed: Basic Metabolic Panel: Recent Labs    12/07/22 1051 04/12/23 1101  NA 137 140  K 4.3 4.5  CL 104 105  CO2 27 28  GLUCOSE 85 72  BUN 22 24  CREATININE 1.34 1.57*  CALCIUM 9.4 9.1   Liver Function Tests: No results for input(s): AST, ALT, ALKPHOS, BILITOT, PROT, ALBUMIN in the last 8760 hours. No results for input(s): LIPASE, AMYLASE in the last 8760 hours. No results for input(s): AMMONIA in the last 8760 hours. CBC: Recent Labs    12/07/22 1051 04/12/23 1101  WBC 3.9 3.9  NEUTROABS  --  1,455*  HGB 10.5* 10.8*  HCT 31.7* 32.9*  MCV 91.1 93.2  PLT 158 143   Lipid Panel: Recent Labs    12/07/22 1051  CHOL 120  HDL 55  LDLCALC 52  TRIG 51  CHOLHDL 2.2   TSH: No results for input(s): TSH in the last 8760 hours. A1C: Lab Results  Component Value Date   HGBA1C 6.2 (H) 04/12/2023    Assessment and Plan Assessment & Plan       Hypertension Blood pressure elevated at 142/88 mmHg, possibly due to recent salty food intake. On carvedilol , hydrochlorothiazide , and lisinopril . - Recheck blood pressure in clinic. - Encourage home blood pressure monitoring. - Advise dietary modifications to  reduce salt intake.  Type 2 diabetes mellitus Last A1c was 6.2% in January, indicating good control. On Trulicity  and metformin . Hypoglycemia resolved after discontinuing glipizide . - Order A1c test. - Encourage dietary modifications to include more vegetables, fiber, and lean meats. - Advise limiting sweets and desserts.  Chronic kidney disease Chronic kidney disease with previous labs showing elevated levels. Last kidney function test was in January. - Order kidney function tests.  Hyperlipidemia Currently taking cholesterol medication at bedtime.  Iron  deficiency anemia Previous blood counts were low, currently taking iron  pills.  no recent episodes of blood in stool. - Order iron  level tests.

## 2023-10-26 ENCOUNTER — Ambulatory Visit: Payer: Self-pay | Admitting: Sports Medicine

## 2023-10-26 DIAGNOSIS — D649 Anemia, unspecified: Secondary | ICD-10-CM

## 2023-10-26 LAB — HEMOGLOBIN A1C
Hgb A1c MFr Bld: 6.7 % — ABNORMAL HIGH (ref <5.7)
Mean Plasma Glucose: 146 mg/dL
eAG (mmol/L): 8.1 mmol/L

## 2023-10-26 LAB — BASIC METABOLIC PANEL WITHOUT GFR
BUN/Creatinine Ratio: 15 (calc) (ref 6–22)
BUN: 23 mg/dL (ref 7–25)
CO2: 27 mmol/L (ref 20–32)
Calcium: 9.8 mg/dL (ref 8.6–10.3)
Chloride: 105 mmol/L (ref 98–110)
Creat: 1.52 mg/dL — ABNORMAL HIGH (ref 0.70–1.35)
Glucose, Bld: 86 mg/dL (ref 65–99)
Potassium: 4.3 mmol/L (ref 3.5–5.3)
Sodium: 139 mmol/L (ref 135–146)

## 2023-10-26 LAB — CBC
HCT: 31.5 % — ABNORMAL LOW (ref 38.5–50.0)
Hemoglobin: 10.1 g/dL — ABNORMAL LOW (ref 13.2–17.1)
MCH: 30.5 pg (ref 27.0–33.0)
MCHC: 32.1 g/dL (ref 32.0–36.0)
MCV: 95.2 fL (ref 80.0–100.0)
MPV: 10.7 fL (ref 7.5–12.5)
Platelets: 144 Thousand/uL (ref 140–400)
RBC: 3.31 Million/uL — ABNORMAL LOW (ref 4.20–5.80)
RDW: 12.6 % (ref 11.0–15.0)
WBC: 3.5 Thousand/uL — ABNORMAL LOW (ref 3.8–10.8)

## 2023-10-26 NOTE — Telephone Encounter (Signed)
    Latest Ref Rng & Units 10/25/2023    9:37 AM 04/12/2023   11:01 AM 12/07/2022   10:51 AM  CBC  WBC 3.8 - 10.8 Thousand/uL 3.5  3.9  3.9   Hemoglobin 13.2 - 17.1 g/dL 89.8  89.1  89.4   Hematocrit 38.5 - 50.0 % 31.5  32.9  31.7   Platelets 140 - 400 Thousand/uL 144  143  158    Iron /TIBC/Ferritin/ %Sat    Component Value Date/Time   IRON  67 12/07/2022 1051   TIBC 303 12/07/2022 1051   FERRITIN 69 12/07/2022 1051   IRONPCTSAT 22 12/07/2022 1051   He declined colonoscopy  Will order occult blood     Latest Ref Rng & Units 10/25/2023    9:37 AM 04/12/2023   11:01 AM 12/07/2022   10:51 AM  BMP  Glucose 65 - 99 mg/dL 86  72  85   BUN 7 - 25 mg/dL 23  24  22    Creatinine 0.70 - 1.35 mg/dL 8.47  8.42  8.65   BUN/Creat Ratio 6 - 22 (calc) 15  15  SEE NOTE:   Sodium 135 - 146 mmol/L 139  140  137   Potassium 3.5 - 5.3 mmol/L 4.3  4.5  4.3   Chloride 98 - 110 mmol/L 105  105  104   CO2 20 - 32 mmol/L 27  28  27    Calcium 8.6 - 10.3 mg/dL 9.8  9.1  9.4

## 2023-10-31 ENCOUNTER — Other Ambulatory Visit: Payer: Self-pay | Admitting: Sports Medicine

## 2023-10-31 DIAGNOSIS — E1169 Type 2 diabetes mellitus with other specified complication: Secondary | ICD-10-CM

## 2023-11-14 ENCOUNTER — Other Ambulatory Visit: Payer: Self-pay | Admitting: Sports Medicine

## 2023-11-14 ENCOUNTER — Other Ambulatory Visit: Payer: Self-pay | Admitting: Family Medicine

## 2023-11-14 NOTE — Telephone Encounter (Signed)
 Patient has request refill on medication Trulicity . Medication last refilled with zero refills. Medication pend and sent to PCP [name], MD for approval.

## 2023-11-14 NOTE — Telephone Encounter (Signed)
 Medication sent to covering provider Leonarda Burdock, NP

## 2023-11-16 ENCOUNTER — Other Ambulatory Visit: Payer: Self-pay | Admitting: Sports Medicine

## 2023-11-19 ENCOUNTER — Other Ambulatory Visit: Payer: Self-pay | Admitting: Sports Medicine

## 2023-11-19 DIAGNOSIS — G629 Polyneuropathy, unspecified: Secondary | ICD-10-CM

## 2023-12-19 ENCOUNTER — Other Ambulatory Visit: Payer: Self-pay | Admitting: Sports Medicine

## 2023-12-25 ENCOUNTER — Other Ambulatory Visit: Payer: Self-pay | Admitting: Sports Medicine

## 2023-12-25 DIAGNOSIS — G629 Polyneuropathy, unspecified: Secondary | ICD-10-CM

## 2023-12-26 NOTE — Telephone Encounter (Signed)
 Patient has request refill on Gabapentin  300mg . Patient medication previously refilled with 90 tablets and zero refills. Medication pend and sent to PCP [name], MD for additional refills.

## 2024-01-22 ENCOUNTER — Other Ambulatory Visit: Payer: Self-pay | Admitting: Sports Medicine

## 2024-01-22 DIAGNOSIS — E1169 Type 2 diabetes mellitus with other specified complication: Secondary | ICD-10-CM

## 2024-01-25 ENCOUNTER — Other Ambulatory Visit: Payer: Self-pay | Admitting: Sports Medicine

## 2024-01-25 ENCOUNTER — Ambulatory Visit: Admitting: Sports Medicine

## 2024-01-27 ENCOUNTER — Other Ambulatory Visit: Payer: Self-pay | Admitting: Family Medicine

## 2024-01-31 ENCOUNTER — Encounter: Admitting: Sports Medicine

## 2024-02-09 ENCOUNTER — Other Ambulatory Visit: Payer: Self-pay | Admitting: Sports Medicine

## 2024-02-11 ENCOUNTER — Other Ambulatory Visit: Payer: Self-pay | Admitting: Sports Medicine

## 2024-02-25 ENCOUNTER — Other Ambulatory Visit: Payer: Self-pay | Admitting: Nurse Practitioner

## 2024-02-26 ENCOUNTER — Other Ambulatory Visit: Payer: Self-pay | Admitting: Sports Medicine

## 2024-02-27 ENCOUNTER — Other Ambulatory Visit: Payer: Self-pay | Admitting: Family Medicine

## 2024-02-27 ENCOUNTER — Other Ambulatory Visit: Payer: Self-pay | Admitting: Sports Medicine

## 2024-03-09 ENCOUNTER — Other Ambulatory Visit: Payer: Self-pay | Admitting: Nurse Practitioner

## 2024-03-09 ENCOUNTER — Encounter: Payer: Self-pay | Admitting: Sports Medicine

## 2024-03-09 ENCOUNTER — Other Ambulatory Visit: Payer: Self-pay | Admitting: Family

## 2024-03-09 ENCOUNTER — Other Ambulatory Visit: Payer: Self-pay | Admitting: Sports Medicine

## 2024-03-09 DIAGNOSIS — G8929 Other chronic pain: Secondary | ICD-10-CM

## 2024-03-12 ENCOUNTER — Other Ambulatory Visit: Payer: Self-pay | Admitting: Sports Medicine

## 2024-03-16 ENCOUNTER — Ambulatory Visit: Admitting: Sports Medicine

## 2024-03-16 ENCOUNTER — Encounter: Payer: Self-pay | Admitting: Sports Medicine

## 2024-03-16 VITALS — BP 169/81 | HR 56 | Temp 97.8°F | Wt 241.0 lb

## 2024-03-16 DIAGNOSIS — Z1211 Encounter for screening for malignant neoplasm of colon: Secondary | ICD-10-CM | POA: Diagnosis not present

## 2024-03-16 DIAGNOSIS — E1140 Type 2 diabetes mellitus with diabetic neuropathy, unspecified: Secondary | ICD-10-CM | POA: Diagnosis not present

## 2024-03-16 DIAGNOSIS — M545 Low back pain, unspecified: Secondary | ICD-10-CM | POA: Diagnosis not present

## 2024-03-16 DIAGNOSIS — Z7984 Long term (current) use of oral hypoglycemic drugs: Secondary | ICD-10-CM | POA: Diagnosis not present

## 2024-03-16 DIAGNOSIS — G8929 Other chronic pain: Secondary | ICD-10-CM

## 2024-03-16 DIAGNOSIS — D509 Iron deficiency anemia, unspecified: Secondary | ICD-10-CM | POA: Diagnosis not present

## 2024-03-16 DIAGNOSIS — E782 Mixed hyperlipidemia: Secondary | ICD-10-CM | POA: Diagnosis not present

## 2024-03-16 DIAGNOSIS — G629 Polyneuropathy, unspecified: Secondary | ICD-10-CM

## 2024-03-16 DIAGNOSIS — I10 Essential (primary) hypertension: Secondary | ICD-10-CM | POA: Diagnosis not present

## 2024-03-16 LAB — CBC WITH DIFFERENTIAL/PLATELET
Basophils Absolute: 0 K/uL (ref 0.0–0.1)
Basophils Relative: 0.3 % (ref 0.0–3.0)
Eosinophils Absolute: 0 K/uL (ref 0.0–0.7)
Eosinophils Relative: 1 % (ref 0.0–5.0)
HCT: 29.9 % — ABNORMAL LOW (ref 39.0–52.0)
Hemoglobin: 9.8 g/dL — ABNORMAL LOW (ref 13.0–17.0)
Lymphocytes Relative: 44.3 % (ref 12.0–46.0)
Lymphs Abs: 1.8 K/uL (ref 0.7–4.0)
MCHC: 32.9 g/dL (ref 30.0–36.0)
MCV: 94.5 fl (ref 78.0–100.0)
Monocytes Absolute: 0.5 K/uL (ref 0.1–1.0)
Monocytes Relative: 11.5 % (ref 3.0–12.0)
Neutro Abs: 1.7 K/uL (ref 1.4–7.7)
Neutrophils Relative %: 42.9 % — ABNORMAL LOW (ref 43.0–77.0)
Platelets: 166 K/uL (ref 150.0–400.0)
RBC: 3.16 Mil/uL — ABNORMAL LOW (ref 4.22–5.81)
RDW: 13.7 % (ref 11.5–15.5)
WBC: 4 K/uL (ref 4.0–10.5)

## 2024-03-16 LAB — MICROALBUMIN / CREATININE URINE RATIO
Creatinine,U: 63.8 mg/dL
Microalb Creat Ratio: 231.1 mg/g — ABNORMAL HIGH (ref 0.0–30.0)
Microalb, Ur: 14.7 mg/dL — ABNORMAL HIGH (ref 0.7–1.9)

## 2024-03-16 MED ORDER — AMLODIPINE BESYLATE 10 MG PO TABS: 10.0000 mg | ORAL_TABLET | Freq: Every day | ORAL | 0 refills | Status: AC

## 2024-03-16 MED ORDER — METFORMIN HCL ER 500 MG PO TB24: 1000.0000 mg | ORAL_TABLET | Freq: Every day | ORAL | 0 refills | Status: AC

## 2024-03-16 MED ORDER — GABAPENTIN 300 MG PO CAPS: 300.0000 mg | ORAL_CAPSULE | Freq: Three times a day (TID) | ORAL | 1 refills | Status: AC

## 2024-03-16 MED ORDER — CETIRIZINE HCL 10 MG PO TABS: 10.0000 mg | ORAL_TABLET | Freq: Every day | ORAL | 1 refills | Status: AC

## 2024-03-16 MED ORDER — HYDROCHLOROTHIAZIDE 25 MG PO TABS: 25.0000 mg | ORAL_TABLET | Freq: Every day | ORAL | 1 refills | Status: AC

## 2024-03-16 MED ORDER — LISINOPRIL 40 MG PO TABS: 40.0000 mg | ORAL_TABLET | Freq: Every day | ORAL | 0 refills | Status: AC

## 2024-03-16 MED ORDER — SIMVASTATIN 20 MG PO TABS: 20.0000 mg | ORAL_TABLET | Freq: Every day | ORAL | 0 refills | Status: AC

## 2024-03-16 MED ORDER — TIZANIDINE HCL 4 MG PO TABS: 4.0000 mg | ORAL_TABLET | Freq: Three times a day (TID) | ORAL | 0 refills | Status: AC | PRN

## 2024-03-16 MED ORDER — CARVEDILOL 25 MG PO TABS: 25.0000 mg | ORAL_TABLET | Freq: Two times a day (BID) | ORAL | 0 refills | Status: AC

## 2024-03-16 NOTE — Addendum Note (Signed)
 Addended by: FLETA CARE D on: 03/16/2024 04:31 PM   Modules accepted: Orders

## 2024-03-16 NOTE — Assessment & Plan Note (Addendum)
 Cont with zocor 

## 2024-03-16 NOTE — Assessment & Plan Note (Addendum)
 Lab Results  Component Value Date   HGBA1C 6.7 (H) 10/25/2023   HGBA1C 6.2 (H) 04/12/2023   HGBA1C 6.8 (H) 06/30/2022    Orders:   Microalbumin / creatinine urine ratio   Basic Metabolic Panel (BMET)   metFORMIN  (GLUCOPHAGE -XR) 500 MG 24 hr tablet; Take 2 tablets (1,000 mg total) by mouth daily.   simvastatin  (ZOCOR ) 20 MG tablet; Take 1 tablet (20 mg total) by mouth daily.

## 2024-03-16 NOTE — Patient Instructions (Signed)

## 2024-03-16 NOTE — Assessment & Plan Note (Addendum)
 Repeat bp still high  Will add amlodipine  Cont with the coreg , hydrochlorothiazide , lisinopril  Monitor bp daily and keep a log Avoid salty foods Exercise regularly  Follow up in 4 weeks Orders:   carvedilol  (COREG ) 25 MG tablet; Take 1 tablet (25 mg total) by mouth 2 (two) times daily with a meal.   hydrochlorothiazide  (HYDRODIURIL ) 25 MG tablet; Take 1 tablet (25 mg total) by mouth daily.   lisinopril  (ZESTRIL ) 40 MG tablet; Take 1 tablet (40 mg total) by mouth daily.

## 2024-03-16 NOTE — Progress Notes (Signed)
 "  Careteam: Patient Care Team: [name], MD as PCP - General (Internal Medicine) Pa, [facility] (Ophthalmology)  Allergies[1]  Chief Complaint  Patient presents with   Follow-up    Transfer of care and follow up. Pt will need some refills.    Discussed the use of AI scribe software for clinical note transcription with the patient, who gave verbal consent to proceed.  History of Present Illness Nathan Armstrong is a 63 year old male with hypertension and diabetes who presents for medication refills and blood pressure management. He is accompanied by his wife.  He has not been taking his blood pressure medication at home and rarely checks his blood pressure. He engages in physical activity through his work, walking approximately three miles a night. No recent changes in diet, such as increased intake of salty or fried foods. No chest pain, dizziness, or lightheadedness.    He occasionally notices darker stools, which he attributes to taking iron  pills. His hemoglobin was previously low. He has not had a colonoscopy since 2019, where a small polyp was found, and insurance issues have prevented a recent one.  He experienced a fall in early October after tripping over a ball, resulting in pain that lasted about six weeks. He occasionally experiences back pain but notes it is not as severe as before.  He is currently taking metformin , gabapentin  three times a day, and tizanidine  three times a day as needed for muscle spasms. He is also on Coreg  and other blood pressure medications.  He has not received a flu shot for the current season and reports a sore throat for the past couple of days, with a runny nose occurring intermittently. No fever or cough. He mentions occasional wheezing when lying down, which has improved since his recent illness. His wife also has similar symptoms and she tested negative for flu, covid     Review of Systems:  Review of Systems   Constitutional:  Negative for chills and fever.  HENT:  Negative for congestion and sore throat.   Eyes:  Negative for blurred vision.  Respiratory:  Negative for cough, sputum production and shortness of breath.   Cardiovascular:  Negative for chest pain, palpitations and leg swelling.  Gastrointestinal:  Negative for abdominal pain, heartburn and nausea.  Genitourinary:  Negative for dysuria, frequency and hematuria.  Musculoskeletal:  Negative for falls and myalgias.  Neurological:  Negative for dizziness, sensory change and focal weakness.   Negative unless indicated in HPI.   Patient Active Problem List   Diagnosis Date Noted   Hypertension    Type 2 diabetes mellitus with diabetic neuropathy, unspecified (HCC) 03/10/2021   HLD (hyperlipidemia)    Special screening for malignant neoplasms, colon    Polyp of sigmoid colon    Benign neoplasm of transverse colon    Past Medical History:  Diagnosis Date   Arthritis    Class 2 obesity    Diabetes mellitus without complication (HCC)    HLD (hyperlipidemia)    Hypertension    Vitamin D  deficiency    Past Surgical History:  Procedure Laterality Date   CIRCUMCISION     COLONOSCOPY WITH PROPOFOL  N/A 05/27/2017   Procedure: COLONOSCOPY WITH PROPOFOL ;  Surgeon: Jinny Carmine, MD;  Location: [facility];  Service: Endoscopy;  Laterality: N/A;  Diabetic   POLYPECTOMY  05/27/2017   Procedure: POLYPECTOMY;  Surgeon: Jinny Carmine, MD;  Location: [facility];  Service: Endoscopy;;   Social History[2] History reviewed.  No pertinent family history. Allergies[3]  Medications: Patient's Medications  New Prescriptions   AMLODIPINE  (NORVASC ) 10 MG TABLET    Take 1 tablet (10 mg total) by mouth daily.  Previous Medications   ASPIRIN 81 MG CHEWABLE TABLET    Chew by mouth daily.   BLOOD GLUCOSE MONITORING SUPPL DEVI    1 each by Does not apply route in the morning, at noon, and at bedtime. May substitute to any manufacturer  covered by patient's insurance.   CHOLECALCIFEROL (VITAMIN D3) 50 MCG (2000 UT) CAPSULE    Take 1 capsule (2,000 Units total) by mouth daily.   DULAGLUTIDE  (TRULICITY ) 3 MG/0.5ML SOAJ    INJECT 3MG  SUBCUTANEOUSLY ONCE A WEEK   FERROUS SULFATE  (FEROSUL) 325 (65 FE) MG TABLET    Take 1 tablet by mouth once daily   GLUCOSE BLOOD (BLOOD GLUCOSE TEST STRIPS 333) STRP    1 Application by Does not apply route daily.   MULTIPLE VITAMIN (MULTIVITAMIN) CAPSULE    Take 1 capsule by mouth daily.   TRULICITY  3 MG/0.5ML SOAJ    INJECT 3 MG SUBCUTANEOUSLY ONCE A WEEK  Modified Medications   Modified Medication Previous Medication   CARVEDILOL  (COREG ) 25 MG TABLET carvedilol  (COREG ) 25 MG tablet      Take 1 tablet (25 mg total) by mouth 2 (two) times daily with a meal.    TAKE 1 TABLET BY MOUTH TWICE DAILY WITH A MEAL   CETIRIZINE  (ZYRTEC ) 10 MG TABLET cetirizine  (ZYRTEC ) 10 MG tablet      Take 1 tablet (10 mg total) by mouth daily.    Take 1 tablet by mouth once daily   GABAPENTIN  (NEURONTIN ) 300 MG CAPSULE gabapentin  (NEURONTIN ) 300 MG capsule      Take 1 capsule (300 mg total) by mouth 3 (three) times daily.    TAKE 1 CAPSULE BY MOUTH THREE TIMES DAILY   HYDROCHLOROTHIAZIDE  (HYDRODIURIL ) 25 MG TABLET hydrochlorothiazide  (HYDRODIURIL ) 25 MG tablet      Take 1 tablet (25 mg total) by mouth daily.    Take 1 tablet by mouth once daily   LISINOPRIL  (ZESTRIL ) 40 MG TABLET lisinopril  (ZESTRIL ) 40 MG tablet      Take 1 tablet (40 mg total) by mouth daily.    Take 1 tablet by mouth once daily   METFORMIN  (GLUCOPHAGE -XR) 500 MG 24 HR TABLET metFORMIN  (GLUCOPHAGE -XR) 500 MG 24 hr tablet      Take 2 tablets (1,000 mg total) by mouth daily.    Take 2 tablets by mouth once daily   SIMVASTATIN  (ZOCOR ) 20 MG TABLET simvastatin  (ZOCOR ) 20 MG tablet      Take 1 tablet (20 mg total) by mouth daily.    Take 1 tablet by mouth once daily   TIZANIDINE  (ZANAFLEX ) 4 MG TABLET tiZANidine  (ZANAFLEX ) 4 MG tablet      Take 1 tablet  (4 mg total) by mouth every 8 (eight) hours as needed for muscle spasms.    TAKE 1 TABLET BY MOUTH THREE TIMES DAILY FOR BACK PAIN  Discontinued Medications   No medications on file    Physical Exam: Vitals:   03/16/24 1024 03/16/24 1029  BP: (!) 167/82 (!) 169/81  Pulse: (!) 56   Temp: 97.8 F (36.6 C)   TempSrc: Oral   SpO2: 100%   Weight: 241 lb (109.3 kg)    Body mass index is 30.94 kg/m. BP Readings from Last 3 Encounters:  03/16/24 (!) 169/81  10/25/23 (!) 150/88  06/14/23 134/84  Wt Readings from Last 3 Encounters:  03/16/24 241 lb (109.3 kg)  10/25/23 243 lb 6.4 oz (110.4 kg)  06/14/23 252 lb 12.8 oz (114.7 kg)    Physical Exam Constitutional:      Appearance: Normal appearance.  HENT:     Head: Normocephalic and atraumatic.     Left Ear: Tympanic membrane normal.     Mouth/Throat:     Pharynx: No oropharyngeal exudate or posterior oropharyngeal erythema.     Comments: No sinus tenderness Cardiovascular:     Rate and Rhythm: Normal rate and regular rhythm.     Pulses: Normal pulses.     Heart sounds: Normal heart sounds.  Pulmonary:     Effort: No respiratory distress.     Breath sounds: No stridor. No wheezing or rales.  Abdominal:     General: Bowel sounds are normal. There is no distension.     Palpations: Abdomen is soft.     Tenderness: There is no abdominal tenderness. There is no guarding.  Musculoskeletal:        General: No swelling.  Neurological:     Mental Status: He is alert. Mental status is at baseline.     Sensory: No sensory deficit.     Motor: No weakness.     Labs reviewed: Basic Metabolic Panel: Recent Labs    04/12/23 1101 10/25/23 0937  NA 140 139  K 4.5 4.3  CL 105 105  CO2 28 27  GLUCOSE 72 86  BUN 24 23  CREATININE 1.57* 1.52*  CALCIUM 9.1 9.8   Liver Function Tests: No results for input(s): AST, ALT, ALKPHOS, BILITOT, PROT, ALBUMIN in the last 8760 hours. No results for input(s): LIPASE,  AMYLASE in the last 8760 hours. No results for input(s): AMMONIA in the last 8760 hours. CBC: Recent Labs    04/12/23 1101 10/25/23 0937  WBC 3.9 3.5*  NEUTROABS 1,455*  --   HGB 10.8* 10.1*  HCT 32.9* 31.5*  MCV 93.2 95.2  PLT 143 144   Lipid Panel: No results for input(s): CHOL, HDL, LDLCALC, TRIG, CHOLHDL, LDLDIRECT in the last 8760 hours. TSH: No results for input(s): TSH in the last 8760 hours. A1C: Lab Results  Component Value Date   HGBA1C 6.7 (H) 10/25/2023    Assessment & Plan Primary hypertension Repeat bp still high  Will add amlodipine  Cont with the coreg , hydrochlorothiazide , lisinopril  Monitor bp daily and keep a log Avoid salty foods Exercise regularly  Follow up in 4 weeks Orders:   carvedilol  (COREG ) 25 MG tablet; Take 1 tablet (25 mg total) by mouth 2 (two) times daily with a meal.   hydrochlorothiazide  (HYDRODIURIL ) 25 MG tablet; Take 1 tablet (25 mg total) by mouth daily.   lisinopril  (ZESTRIL ) 40 MG tablet; Take 1 tablet (40 mg total) by mouth daily.  Type 2 diabetes mellitus with diabetic neuropathy, without long-term current use of insulin (HCC) Lab Results  Component Value Date   HGBA1C 6.7 (H) 10/25/2023   HGBA1C 6.2 (H) 04/12/2023   HGBA1C 6.8 (H) 06/30/2022    Orders:   Microalbumin / creatinine urine ratio   Basic Metabolic Panel (BMET)   metFORMIN  (GLUCOPHAGE -XR) 500 MG 24 hr tablet; Take 2 tablets (1,000 mg total) by mouth daily.   simvastatin  (ZOCOR ) 20 MG tablet; Take 1 tablet (20 mg total) by mouth daily.  Mixed hyperlipidemia Cont with zocor     Screening for colon cancer Pt says his insurance declined payment  However he had colonoscopy in 2019 Instructed  patient to check with his insurance     Neuropathy Cont with gabapentin  Orders:   gabapentin  (NEURONTIN ) 300 MG capsule; Take 1 capsule (300 mg total) by mouth 3 (three) times daily.  Chronic midline low back pain without sciatica Intermittent pain   Informed patient about the side effects of the medication Orders:   tiZANidine  (ZANAFLEX ) 4 MG tablet; Take 1 tablet (4 mg total) by mouth every 8 (eight) hours as needed for muscle spasms.  Iron  deficiency anemia, unspecified iron  deficiency anemia type Will repeat labs Orders:   CBC with Differential/Platelet   Iron , TIBC and Ferritin Panel   B12   Return in about 4 weeks (around 04/13/2024).:   Nathan Armstrong     [1] No Known Allergies [2]  Social History Tobacco Use   Smoking status: Never   Smokeless tobacco: Never  Vaping Use   Vaping status: Never Used  Substance Use Topics   Alcohol use: Yes    Comment: occasional   Drug use: No  [3] No Known Allergies  "

## 2024-03-17 LAB — BASIC METABOLIC PANEL WITH GFR
BUN/Creatinine Ratio: 18 (calc) (ref 6–22)
BUN: 27 mg/dL — ABNORMAL HIGH (ref 7–25)
CO2: 27 mmol/L (ref 20–32)
Calcium: 9.6 mg/dL (ref 8.6–10.3)
Chloride: 103 mmol/L (ref 98–110)
Creat: 1.48 mg/dL — ABNORMAL HIGH (ref 0.70–1.35)
Glucose, Bld: 76 mg/dL (ref 65–99)
Potassium: 4.2 mmol/L (ref 3.5–5.3)
Sodium: 135 mmol/L (ref 135–146)
eGFR: 53 mL/min/1.73m2 — ABNORMAL LOW

## 2024-03-17 LAB — VITAMIN B12: Vitamin B-12: 814 pg/mL (ref 200–1100)

## 2024-03-17 LAB — IRON,TIBC AND FERRITIN PANEL
%SAT: 27 % (ref 20–48)
Ferritin: 194 ng/mL (ref 24–380)
Iron: 78 ug/dL (ref 50–180)
TIBC: 294 ug/dL (ref 250–425)

## 2024-03-19 ENCOUNTER — Ambulatory Visit: Payer: Self-pay | Admitting: Sports Medicine

## 2024-04-13 ENCOUNTER — Ambulatory Visit: Admitting: Sports Medicine

## 2024-04-13 ENCOUNTER — Encounter: Payer: Self-pay | Admitting: Sports Medicine

## 2024-04-13 VITALS — BP 113/66 | HR 57 | Temp 97.8°F | Wt 242.8 lb

## 2024-04-13 DIAGNOSIS — J302 Other seasonal allergic rhinitis: Secondary | ICD-10-CM

## 2024-04-13 DIAGNOSIS — Z125 Encounter for screening for malignant neoplasm of prostate: Secondary | ICD-10-CM

## 2024-04-13 DIAGNOSIS — N1831 Chronic kidney disease, stage 3a: Secondary | ICD-10-CM

## 2024-04-13 DIAGNOSIS — I10 Essential (primary) hypertension: Secondary | ICD-10-CM

## 2024-04-13 DIAGNOSIS — D649 Anemia, unspecified: Secondary | ICD-10-CM

## 2024-04-13 LAB — URINALYSIS
Bilirubin Urine: NEGATIVE
Hgb urine dipstick: NEGATIVE
Ketones, ur: NEGATIVE
Leukocytes,Ua: NEGATIVE
Nitrite: NEGATIVE
Specific Gravity, Urine: 1.02 (ref 1.000–1.030)
Total Protein, Urine: NEGATIVE
Urine Glucose: NEGATIVE
Urobilinogen, UA: 0.2 (ref 0.0–1.0)
pH: 6 (ref 5.0–8.0)

## 2024-04-13 LAB — PSA: PSA: 0.49 ng/mL (ref 0.10–4.00)

## 2024-04-13 MED ORDER — MONTELUKAST SODIUM 10 MG PO TABS: 10.0000 mg | ORAL_TABLET | Freq: Every day | ORAL | 3 refills | Status: AC

## 2024-04-13 MED ORDER — FLUTICASONE PROPIONATE 50 MCG/ACT NA SUSP: 2.0000 | Freq: Every day | NASAL | 6 refills | Status: AC

## 2024-04-13 NOTE — Progress Notes (Signed)
 "  Careteam: Patient Care Team: [name], MD as PCP - General (Internal Medicine) Pa, [facility] (Ophthalmology)  Allergies[1]  Chief Complaint  Patient presents with   Follow-up    4 week follow up. He reports still having a runny nose since November. He states it is clear.    Discussed the use of AI scribe software for clinical note transcription with the patient, who gave verbal consent to proceed.  History of Present Illness    Nathan Armstrong is a 64 year old male with hypertension and chronic kidney disease who presents with runny nose and nasal congestion.  He has experienced a runny nose and nasal congestion since Thanksgiving, approximately two months ago. Symptoms include occasional chills and a runny nose that worsens when drinking fluids. No sinus pain, headaches, sore throat, or persistent cough, although he experiences a cough occasionally. No issues with breathing, except for occasional rattling sounds in the evenings, particularly around dust. He has not taken any over-the-counter medications for these symptoms.    He has a history of hypertension and is currently taking amlodipine , Coreg , and lisinopril .  He has chronic kidney disease and reports that recent blood work showed some improvement in kidney function. He is also anemic and is taking iron  pills. He had a colonoscopy in 2017, which revealed a small polyp, but he has not noticed any blood in his stool or urine. He experiences chronic back pain but no new or worsening symptoms.  He reports a decreased appetite, which he attributes to Trulicity , although it is not as severe as when he was on Ozempic. He sometimes feels fatigued but has stable weight since July of the previous year.  He smoked cigarettes in the past but quit around 2012. He currently smokes marijuana. Instructed patient to stop smoking marijuana.   Review of Systems:  Review of Systems  Constitutional:  Positive for  chills and malaise/fatigue. Negative for fever.  HENT:  Positive for congestion. Negative for sinus pain and sore throat.   Eyes:  Negative for blurred vision.  Respiratory:  Negative for cough, sputum production and shortness of breath.   Cardiovascular:  Negative for chest pain, palpitations and leg swelling.  Gastrointestinal:  Negative for abdominal pain, blood in stool, heartburn, melena and nausea.  Genitourinary:  Negative for dysuria, frequency and hematuria.  Musculoskeletal:  Positive for back pain. Negative for falls and myalgias.  Neurological:  Negative for dizziness, sensory change and focal weakness.   Negative unless indicated in HPI.   Patient Active Problem List   Diagnosis Date Noted   Hypertension    Type 2 diabetes mellitus with diabetic neuropathy, unspecified (HCC) 03/10/2021   HLD (hyperlipidemia)    Special screening for malignant neoplasms, colon    Polyp of sigmoid colon    Benign neoplasm of transverse colon    Past Medical History:  Diagnosis Date   Arthritis    Class 2 obesity    Diabetes mellitus without complication (HCC)    HLD (hyperlipidemia)    Hypertension    Vitamin D  deficiency    Past Surgical History:  Procedure Laterality Date   CIRCUMCISION     COLONOSCOPY WITH PROPOFOL  N/A 05/27/2017   Procedure: COLONOSCOPY WITH PROPOFOL ;  Surgeon: Jinny Carmine, MD;  Location: [facility];  Service: Endoscopy;  Laterality: N/A;  Diabetic   POLYPECTOMY  05/27/2017   Procedure: POLYPECTOMY;  Surgeon: Jinny Carmine, MD;  Location: [facility];  Service: Endoscopy;;   Social History[2]  History reviewed. No pertinent family history. Allergies[3]  Medications: Patient's Medications  New Prescriptions   FLUTICASONE  (FLONASE ) 50 MCG/ACT NASAL SPRAY    Place 2 sprays into both nostrils daily.   MONTELUKAST  (SINGULAIR ) 10 MG TABLET    Take 1 tablet (10 mg total) by mouth at bedtime.  Previous Medications   AMLODIPINE  (NORVASC ) 10 MG TABLET     Take 1 tablet (10 mg total) by mouth daily.   ASPIRIN 81 MG CHEWABLE TABLET    Chew by mouth daily.   BLOOD GLUCOSE MONITORING SUPPL DEVI    1 each by Does not apply route in the morning, at noon, and at bedtime. May substitute to any manufacturer covered by patient's insurance.   CARVEDILOL  (COREG ) 25 MG TABLET    Take 1 tablet (25 mg total) by mouth 2 (two) times daily with a meal.   CETIRIZINE  (ZYRTEC ) 10 MG TABLET    Take 1 tablet (10 mg total) by mouth daily.   CHOLECALCIFEROL (VITAMIN D3) 50 MCG (2000 UT) CAPSULE    Take 1 capsule (2,000 Units total) by mouth daily.   DULAGLUTIDE  (TRULICITY ) 3 MG/0.5ML SOAJ    INJECT 3MG  SUBCUTANEOUSLY ONCE A WEEK   FERROUS SULFATE  (FEROSUL) 325 (65 FE) MG TABLET    Take 1 tablet by mouth once daily   GABAPENTIN  (NEURONTIN ) 300 MG CAPSULE    Take 1 capsule (300 mg total) by mouth 3 (three) times daily.   GLUCOSE BLOOD (BLOOD GLUCOSE TEST STRIPS 333) STRP    1 Application by Does not apply route daily.   HYDROCHLOROTHIAZIDE  (HYDRODIURIL ) 25 MG TABLET    Take 1 tablet (25 mg total) by mouth daily.   LISINOPRIL  (ZESTRIL ) 40 MG TABLET    Take 1 tablet (40 mg total) by mouth daily.   METFORMIN  (GLUCOPHAGE -XR) 500 MG 24 HR TABLET    Take 2 tablets (1,000 mg total) by mouth daily.   MULTIPLE VITAMIN (MULTIVITAMIN) CAPSULE    Take 1 capsule by mouth daily.   SIMVASTATIN  (ZOCOR ) 20 MG TABLET    Take 1 tablet (20 mg total) by mouth daily.   TIZANIDINE  (ZANAFLEX ) 4 MG TABLET    Take 1 tablet (4 mg total) by mouth every 8 (eight) hours as needed for muscle spasms.   TRULICITY  3 MG/0.5ML SOAJ    INJECT 3 MG SUBCUTANEOUSLY ONCE A WEEK  Modified Medications   No medications on file  Discontinued Medications   No medications on file    Physical Exam: Vitals:   04/13/24 0950  BP: 113/66  Pulse: (!) 57  Temp: 97.8 F (36.6 C)  TempSrc: Oral  SpO2: 100%  Weight: 242 lb 12.8 oz (110.1 kg)   Body mass index is 31.17 kg/m. BP Readings from Last 3 Encounters:   04/13/24 113/66  03/16/24 (!) 169/81  10/25/23 (!) 150/88   Wt Readings from Last 3 Encounters:  04/13/24 242 lb 12.8 oz (110.1 kg)  03/16/24 241 lb (109.3 kg)  10/25/23 243 lb 6.4 oz (110.4 kg)    Physical Exam Constitutional:      Appearance: Normal appearance.  HENT:     Head: Normocephalic and atraumatic.     Right Ear: Tympanic membrane normal.     Left Ear: Tympanic membrane normal.     Mouth/Throat:     Pharynx: No oropharyngeal exudate or posterior oropharyngeal erythema.     Comments: No sinus tenderness Cardiovascular:     Rate and Rhythm: Normal rate and regular rhythm.     Pulses:  Normal pulses.     Heart sounds: Normal heart sounds.  Pulmonary:     Effort: No respiratory distress.     Breath sounds: No stridor. No wheezing or rales.  Abdominal:     General: Bowel sounds are normal. There is no distension.     Palpations: Abdomen is soft.     Tenderness: There is no abdominal tenderness. There is no guarding.  Musculoskeletal:        General: No swelling.  Neurological:     Mental Status: He is alert. Mental status is at baseline.     Sensory: No sensory deficit.     Motor: No weakness.     Labs reviewed: Basic Metabolic Panel: Recent Labs    10/25/23 0937 03/16/24 1656  NA 139 135  K 4.3 4.2  CL 105 103  CO2 27 27  GLUCOSE 86 76  BUN 23 27*  CREATININE 1.52* 1.48*  CALCIUM 9.8 9.6   Liver Function Tests: No results for input(s): AST, ALT, ALKPHOS, BILITOT, PROT, ALBUMIN in the last 8760 hours. No results for input(s): LIPASE, AMYLASE in the last 8760 hours. No results for input(s): AMMONIA in the last 8760 hours. CBC: Recent Labs    10/25/23 0937 03/16/24 1050  WBC 3.5* 4.0  NEUTROABS  --  1.7  HGB 10.1* 9.8*  HCT 31.5* 29.9*  MCV 95.2 94.5  PLT 144 166.0   Lipid Panel: No results for input(s): CHOL, HDL, LDLCALC, TRIG, CHOLHDL, LDLDIRECT in the last 8760 hours. TSH: No results for input(s): TSH  in the last 8760 hours. A1C: Lab Results  Component Value Date   HGBA1C 6.7 (H) 10/25/2023    Assessment & Plan Primary hypertension Bp at goal  Cont with amlodipine , lisinopril , coreg  DASH DIET Monitor bp daily and keep a log Avoid salty foods Exercise regularly     Stage 3a chronic kidney disease (HCC) Lab Results  Component Value Date   CREATININE 1.48 (H) 03/16/2024   CREATININE 1.52 (H) 10/25/2023   CREATININE 1.57 (H) 04/12/2023   Avoid nephrotoxic meds    Anemia, unspecified type No signs of bleeding    Latest Ref Rng & Units 03/16/2024   10:50 AM 10/25/2023    9:37 AM 04/12/2023   11:01 AM  CBC  WBC 4.0 - 10.5 K/uL 4.0  3.5  3.9   Hemoglobin 13.0 - 17.0 g/dL 9.8  89.8  89.1   Hematocrit 39.0 - 52.0 % 29.9  31.5  32.9   Platelets 150.0 - 400.0 K/uL 166.0  144  143    Will refer to Gi for colonoscopy Will check UA Orders:   Urinalysis   Ambulatory referral to Gastroenterology  Seasonal allergies Cont with flonase , claritin Will add singulair  Orders:   fluticasone  (FLONASE ) 50 MCG/ACT nasal spray; Place 2 sprays into both nostrils daily.   montelukast  (SINGULAIR ) 10 MG tablet; Take 1 tablet (10 mg total) by mouth at bedtime.  Screening for prostate cancer  Orders:   PSA   Return in about 3 months (around 07/12/2024).:   Laquisha Northcraft     [1] No Known Allergies [2]  Social History Tobacco Use   Smoking status: Never   Smokeless tobacco: Never  Vaping Use   Vaping status: Never Used  Substance Use Topics   Alcohol use: Yes    Comment: occasional   Drug use: No  [3] No Known Allergies  "

## 2024-04-13 NOTE — Assessment & Plan Note (Addendum)
 Bp at goal  Cont with amlodipine , lisinopril , coreg  DASH DIET Monitor bp daily and keep a log Avoid salty foods Exercise regularly

## 2024-04-16 ENCOUNTER — Ambulatory Visit: Payer: Self-pay | Admitting: Sports Medicine

## 2024-07-12 ENCOUNTER — Ambulatory Visit: Admitting: Sports Medicine
